# Patient Record
Sex: Male | Born: 1980 | Race: White | Hispanic: No | Marital: Single | Smoking: Current every day smoker
Health system: Southern US, Community
[De-identification: ages and names within clinical notes are randomized; demographics above are authoritative.]

## PROBLEM LIST (undated history)

## (undated) DIAGNOSIS — H269 Unspecified cataract: Secondary | ICD-10-CM

## (undated) DIAGNOSIS — H409 Unspecified glaucoma: Secondary | ICD-10-CM

## (undated) HISTORY — PX: EYE SURGERY: SHX253

---
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
## 2010-11-18 ENCOUNTER — Emergency Department (HOSPITAL_COMMUNITY)
Admission: EM | Admit: 2010-11-18 | Discharge: 2010-11-19 | Disposition: A | Discharge status: Home / Self Care | Payer: Self-pay | Attending: Emergency Medicine | Admitting: Emergency Medicine

## 2010-11-18 DIAGNOSIS — F17200 Nicotine dependence, unspecified, uncomplicated: Secondary | ICD-10-CM | POA: Insufficient documentation

## 2010-11-18 DIAGNOSIS — H109 Unspecified conjunctivitis: Secondary | ICD-10-CM | POA: Insufficient documentation

## 2012-05-11 ENCOUNTER — Emergency Department (HOSPITAL_COMMUNITY)
Admission: EM | Admit: 2012-05-11 | Discharge: 2012-05-11 | Disposition: A | Discharge status: Home / Self Care | Payer: Self-pay | Attending: Emergency Medicine | Admitting: Emergency Medicine

## 2012-05-11 ENCOUNTER — Emergency Department (HOSPITAL_COMMUNITY): Payer: Self-pay

## 2012-05-11 ENCOUNTER — Encounter (HOSPITAL_COMMUNITY): Payer: Self-pay | Admitting: *Deleted

## 2012-05-11 DIAGNOSIS — W1809XA Striking against other object with subsequent fall, initial encounter: Secondary | ICD-10-CM | POA: Insufficient documentation

## 2012-05-11 DIAGNOSIS — R209 Unspecified disturbances of skin sensation: Secondary | ICD-10-CM | POA: Insufficient documentation

## 2012-05-11 DIAGNOSIS — F17200 Nicotine dependence, unspecified, uncomplicated: Secondary | ICD-10-CM | POA: Insufficient documentation

## 2012-05-11 DIAGNOSIS — S6390XA Sprain of unspecified part of unspecified wrist and hand, initial encounter: Secondary | ICD-10-CM | POA: Insufficient documentation

## 2012-05-11 DIAGNOSIS — Y9389 Activity, other specified: Secondary | ICD-10-CM | POA: Insufficient documentation

## 2012-05-11 DIAGNOSIS — Y92009 Unspecified place in unspecified non-institutional (private) residence as the place of occurrence of the external cause: Secondary | ICD-10-CM | POA: Insufficient documentation

## 2012-05-11 DIAGNOSIS — H409 Unspecified glaucoma: Secondary | ICD-10-CM | POA: Insufficient documentation

## 2012-05-11 DIAGNOSIS — S63602A Unspecified sprain of left thumb, initial encounter: Secondary | ICD-10-CM

## 2012-05-11 HISTORY — DX: Unspecified cataract: H26.9

## 2012-05-11 HISTORY — DX: Unspecified glaucoma: H40.9

## 2012-05-11 MED ORDER — IBUPROFEN 800 MG PO TABS
800.0000 mg | ORAL_TABLET | Freq: Once | ORAL | Status: AC
  Administered 2012-05-11: 800 mg via ORAL
  Filled 2012-05-11: qty 1

## 2012-05-11 MED ORDER — HYDROCODONE-ACETAMINOPHEN 5-325 MG PO TABS: 1.0000 | ORAL_TABLET | Freq: Four times a day (QID) | ORAL | Status: AC | PRN

## 2012-05-11 NOTE — ED Provider Notes (Signed)
Medical screening examination/treatment/procedure(s) were performed by non-physician practitioner and as supervising physician I was immediately available for consultation/collaboration.   Dione Booze, MD 05/11/12 [phone]

## 2012-05-11 NOTE — ED Provider Notes (Signed)
History     CSN: [id number]  Arrival date & time 05/11/12  1455   First MD Initiated Contact with Patient 05/11/12 1620      Chief Complaint  Patient presents with  . Hand Pain    (Consider location/radiation/quality/duration/timing/severity/associated sxs/prior treatment) HPI Comments: Tripped over a piece of metal and fell onto L hand at home today.R hand dominant.  No other complaints or injuries.  Patient is a 31 y.o. male presenting with hand pain. The history is provided by the patient. No language interpreter was used.  Hand Pain This is a new problem. The current episode started today. The problem occurs constantly. The problem has been unchanged. Associated symptoms include numbness. Pertinent negatives include no weakness. Exacerbated by: movement. He has tried nothing for the symptoms.    Past Medical History  Diagnosis Date  . Glaucoma   . Cataract     Past Surgical History  Procedure Date  . Eye surgery     History reviewed. No pertinent family history.  History  Substance Use Topics  . Smoking status: Current Every Day Smoker  . Smokeless tobacco: Not on file  . Alcohol Use: No      Review of Systems  Musculoskeletal:       Thumb injury  Skin: Negative for wound.  Neurological: Positive for numbness. Negative for weakness.  All other systems reviewed and are negative.    Allergies  Review of patient's allergies indicates no known allergies.  Home Medications   Current Outpatient Rx  Name  Route  Sig  Dispense  Refill  . ADULT MULTIVITAMIN W/MINERALS CH   Oral   Take 1 tablet by mouth every morning.         Marland Kitchen HYDROCODONE-ACETAMINOPHEN 5-325 MG PO TABS   Oral   Take 1 tablet by mouth every 6 (six) hours as needed for pain.   20 tablet   0     BP 134/96  Pulse 68  Temp 97.9 F (36.6 C) (Oral)  Resp 18  Ht 5\' 9"  (1.753 m)  Wt 205 lb (92.987 kg)  BMI 30.27 kg/m2  SpO2 99%  Physical Exam  Nursing note and vitals  reviewed. Constitutional: He is oriented to person, place, and time. He appears well-developed and well-nourished.  HENT:  Head: Normocephalic and atraumatic.  Eyes: EOM are normal.  Neck: Normal range of motion.  Cardiovascular: Normal rate, regular rhythm, normal heart sounds and intact distal pulses.   Pulmonary/Chest: Effort normal and breath sounds normal. No respiratory distress.  Abdominal: Soft. He exhibits no distension. There is no tenderness.  Musculoskeletal: He exhibits tenderness.       Left hand: He exhibits decreased range of motion and tenderness. normal sensation noted. Normal strength noted.       Hands: Neurological: He is alert and oriented to person, place, and time.  Skin: Skin is warm and dry.  Psychiatric: He has a normal mood and affect. Judgment normal.    ED Course  Procedures (including critical care time)  Labs Reviewed - No data to display Dg Finger Thumb Left  05/11/2012  *RADIOLOGY REPORT*  Clinical Data: Diffuse thumb pain post fall  LEFT THUMB 2+V  Comparison: None  Findings: Osseous mineralization normal. Joint spaces preserved. Question nondisplaced metaphyseal fracture at base of distal phalanx, seen only on oblique view. No additional fracture, dislocation, or bone destruction identified.  IMPRESSION: Question nondisplaced metaphyseal fracture at base of distal phalanx left thumb.   Original Report Authenticated By: Loraine Leriche  Tyron Russell, M.D.    Discussed x-ray results with pt.  He denies having any pain in the distal phalynx at all.  1. Left thumb sprain       MDM  Thumb spica Ice, elevation rx-hydrocodone, 20 F/u with dr. Hilda Lias prn.        Evalina Field, [place] 05/11/12 1705

## 2012-05-11 NOTE — ED Notes (Signed)
Pain lt thumb, onset today after falling on it

## 2015-03-03 ENCOUNTER — Ambulatory Visit (HOSPITAL_COMMUNITY)
Admission: RE | Admit: 2015-03-03 | Discharge: 2015-03-03 | Disposition: A | Discharge status: Home / Self Care | Payer: Self-pay | Source: Ambulatory Visit | Attending: Physician Assistant | Admitting: Physician Assistant

## 2015-03-03 ENCOUNTER — Other Ambulatory Visit: Payer: Self-pay | Admitting: Physician Assistant

## 2015-03-03 DIAGNOSIS — F17200 Nicotine dependence, unspecified, uncomplicated: Secondary | ICD-10-CM | POA: Insufficient documentation

## 2015-03-03 DIAGNOSIS — R05 Cough: Secondary | ICD-10-CM | POA: Insufficient documentation

## 2015-03-03 DIAGNOSIS — R0600 Dyspnea, unspecified: Secondary | ICD-10-CM | POA: Insufficient documentation

## 2015-04-10 ENCOUNTER — Other Ambulatory Visit: Payer: Self-pay | Admitting: Physician Assistant

## 2015-04-10 LAB — CBC WITH DIFFERENTIAL/PLATELET
BASOS ABS: 0 10*3/uL (ref 0.0–0.1)
BASOS PCT: 0 % (ref 0–1)
Eosinophils Absolute: 0.3 10*3/uL (ref 0.0–0.7)
Eosinophils Relative: 2 % (ref 0–5)
HEMATOCRIT: 46.3 % (ref 39.0–52.0)
HEMOGLOBIN: 16.4 g/dL (ref 13.0–17.0)
LYMPHS PCT: 27 % (ref 12–46)
Lymphs Abs: 3.9 10*3/uL (ref 0.7–4.0)
MCH: 29.4 pg (ref 26.0–34.0)
MCHC: 35.4 g/dL (ref 30.0–36.0)
MCV: 83.1 fL (ref 78.0–100.0)
MONO ABS: 0.7 10*3/uL (ref 0.1–1.0)
MPV: 10.2 fL (ref 8.6–12.4)
Monocytes Relative: 5 % (ref 3–12)
NEUTROS ABS: 9.6 10*3/uL — AB (ref 1.7–7.7)
NEUTROS PCT: 66 % (ref 43–77)
Platelets: 283 10*3/uL (ref 150–400)
RBC: 5.57 MIL/uL (ref 4.22–5.81)
RDW: 14 % (ref 11.5–15.5)
WBC: 14.5 10*3/uL — AB (ref 4.0–10.5)

## 2015-04-13 ENCOUNTER — Ambulatory Visit: Payer: Self-pay | Admitting: Physician Assistant

## 2015-04-13 ENCOUNTER — Encounter: Payer: Self-pay | Admitting: Physician Assistant

## 2015-04-13 VITALS — BP 110/74 | HR 62 | Temp 98.1°F | Ht 68.0 in | Wt 206.5 lb

## 2015-04-13 DIAGNOSIS — D72829 Elevated white blood cell count, unspecified: Secondary | ICD-10-CM

## 2015-04-13 NOTE — Progress Notes (Signed)
BP 110/74 mmHg  Pulse 62  Temp(Src) 98.1 F (36.7 C)  Ht '5\' 8"'  (1.727 m)  Wt 206 lb 8 oz (93.668 kg)  BMI 31.41 kg/m2  SpO2 98%   Subjective:    Patient ID: Lucas Dickerson, male    DOB: [birth year], 34 y.o.   MRN: [id number]  HPI: Lucas Dickerson is a 34 y.o. male presenting on 04/13/2015 for Abnormal Lab   HPI   Pt states he turned in his [employer] discount application.  Says he never got letter about it but he got a bill for the CXR for $30-something.  Pt here with recheck WBC which has been persistently high since becoming a pt here in may.  There is no sign infection.  Relevant past medical, surgical, family and social history reviewed and updated as indicated. Interim medical history since our last visit reviewed. Allergies and medications reviewed and updated.  Current outpatient prescriptions:  .  Doxylamine-Phenylephrine-APAP (VICKS SINEX NIGHTTIME PO), Take by mouth at bedtime., Disp: , Rfl:    Review of Systems  Constitutional: Positive for appetite change. Negative for fever, chills, diaphoresis, fatigue and unexpected weight change.  HENT: Positive for congestion, dental problem and sneezing. Negative for drooling, ear pain, facial swelling, hearing loss, mouth sores, sore throat, trouble swallowing and voice change.   Eyes: Positive for pain, discharge, redness and itching. Negative for visual disturbance.  Respiratory: Positive for cough, shortness of breath and wheezing. Negative for choking.   Cardiovascular: Positive for chest pain. Negative for palpitations and leg swelling.  Gastrointestinal: Negative for vomiting, abdominal pain, diarrhea, constipation and blood in stool.  Endocrine: Positive for polydipsia. Negative for cold intolerance and heat intolerance.  Genitourinary: Negative for dysuria, hematuria and decreased urine volume.  Musculoskeletal: Positive for gait problem. Negative for back pain and arthralgias.  Skin: Negative for rash.   Allergic/Immunologic: Positive for environmental allergies.  Neurological: Positive for headaches. Negative for seizures, syncope and light-headedness.  Hematological: Negative for adenopathy.  Psychiatric/Behavioral: Positive for agitation. Negative for suicidal ideas and dysphoric mood. The patient is nervous/anxious.     Per HPI unless specifically indicated above     Objective:    BP 110/74 mmHg  Pulse 62  Temp(Src) 98.1 F (36.7 C)  Ht '5\' 8"'  (1.727 m)  Wt 206 lb 8 oz (93.668 kg)  BMI 31.41 kg/m2  SpO2 98%  Wt Readings from Last 3 Encounters:  04/13/15 206 lb 8 oz (93.668 kg)  05/11/12 205 lb (92.987 kg)    Physical Exam  Constitutional: He is oriented to person, place, and time. He appears well-developed and well-nourished.  HENT:  Head: Normocephalic and atraumatic.  Neck: Neck supple.  Cardiovascular: Normal rate and regular rhythm.   Pulmonary/Chest: Effort normal and breath sounds normal. He has no wheezes.  Abdominal: Soft. Bowel sounds are normal. There is no tenderness.  Musculoskeletal: He exhibits no edema.  Lymphadenopathy:    He has no cervical adenopathy.  Neurological: He is alert and oriented to person, place, and time.  Skin: Skin is warm and dry.  Psychiatric: He has a normal mood and affect. His behavior is normal.  Vitals reviewed.  L great toe mildly inflamed and reddish. No d/c  Results for orders placed or performed in visit on 04/10/15  CBC with Differential/Platelet  Result Value Ref Range   WBC 14.5 (H) 4.0 - 10.5 K/uL   RBC 5.57 4.22 - 5.81 MIL/uL   Hemoglobin 16.4 13.0 - 17.0 g/dL   HCT  46.3 39.0 - 52.0 %   MCV 83.1 78.0 - 100.0 fL   MCH 29.4 26.0 - 34.0 pg   MCHC 35.4 30.0 - 36.0 g/dL   RDW 14.0 11.5 - 15.5 %   Platelets 283 150 - 400 K/uL   MPV 10.2 8.6 - 12.4 fL   Neutrophils Relative % 66 43 - 77 %   Neutro Abs 9.6 (H) 1.7 - 7.7 K/uL   Lymphocytes Relative 27 12 - 46 %   Lymphs Abs 3.9 0.7 - 4.0 K/uL   Monocytes Relative 5  3 - 12 %   Monocytes Absolute 0.7 0.1 - 1.0 K/uL   Eosinophils Relative 2 0 - 5 %   Eosinophils Absolute 0.3 0.0 - 0.7 K/uL   Basophils Relative 0 0 - 1 %   Basophils Absolute 0.0 0.0 - 0.1 K/uL   Smear Review Criteria for review not met       Assessment & Plan:    Encounter Diagnosis  Name Primary?  . Leukocytosis Yes    Reviewed labs/cxr  with pt. WBC still high, cxr normal. Refer tp hematology for further evaluation  Counseled pt again on ways to help ingrowing toenail (wicking cotton under nail, avoiding too small shoes)  Will check to see if general surgeon will remove toenail ([facility]) F/u 3 mo. rto sooner prn

## 2015-04-14 DIAGNOSIS — D72829 Elevated white blood cell count, unspecified: Secondary | ICD-10-CM | POA: Insufficient documentation

## 2015-07-14 ENCOUNTER — Encounter: Payer: Self-pay | Admitting: Physician Assistant

## 2015-07-14 ENCOUNTER — Ambulatory Visit: Payer: Self-pay | Admitting: Physician Assistant

## 2015-07-14 VITALS — BP 106/68 | HR 79 | Temp 99.3°F | Ht 68.0 in | Wt 203.2 lb

## 2015-07-14 DIAGNOSIS — F17210 Nicotine dependence, cigarettes, uncomplicated: Secondary | ICD-10-CM

## 2015-07-14 DIAGNOSIS — D72829 Elevated white blood cell count, unspecified: Secondary | ICD-10-CM

## 2015-07-14 NOTE — Progress Notes (Signed)
   BP 106/68 mmHg  Pulse 79  Temp(Src) 99.3 F (37.4 C)  Ht  (1.727 m)  Wt 203 lb 3.2 oz (92.171 kg)  BMI 30.90 kg/m2  SpO2 95%   Subjective:    Patient ID: Lucas Dickerson, male    DOB: Jun 24, 1980, 35 y.o.   MRN: [id number]  HPI: Lucas Dickerson is a 35 y.o. male presenting on 07/14/2015 for Follow-up   HPI   pt states he got approved for [employer] discount.  He thinks it's 100% but isn't certain and he doesn't know when it is good until.  Pt states he got his bothersome toenail removed.  Says eyes bothering him some lately with allergies, mostly the right one.  Also some nasal stopped up.  Relevant past medical, surgical, family and social history reviewed and updated as indicated. Interim medical history since our last visit reviewed. Allergies and medications reviewed and updated.  No current outpatient prescriptions on file.   Review of Systems  Constitutional: Positive for appetite change and fatigue. Negative for fever, chills, diaphoresis and unexpected weight change.  HENT: Positive for congestion and dental problem. Negative for drooling, ear pain, facial swelling, hearing loss, mouth sores, sneezing, sore throat, trouble swallowing and voice change.   Eyes: Positive for pain, discharge, redness and itching. Negative for visual disturbance.  Respiratory: Positive for cough. Negative for choking, shortness of breath and wheezing.   Cardiovascular: Negative for chest pain, palpitations and leg swelling.  Gastrointestinal: Negative for vomiting, abdominal pain, diarrhea, constipation and blood in stool.  Endocrine: Positive for cold intolerance and heat intolerance. Negative for polydipsia.  Genitourinary: Negative for dysuria, hematuria and decreased urine volume.  Musculoskeletal: Negative for back pain, arthralgias and gait problem.  Skin: Negative for rash.  Allergic/Immunologic: Positive for environmental allergies.  Neurological: Negative for seizures, syncope,  light-headedness and headaches.  Hematological: Negative for adenopathy.  Psychiatric/Behavioral: Negative for suicidal ideas, dysphoric mood and agitation. The patient is nervous/anxious.     Per HPI unless specifically indicated above     Objective:    BP 106/68 mmHg  Pulse 79  Temp(Src) 99.3 F (37.4 C)  Ht  (1.727 m)  Wt 203 lb 3.2 oz (92.171 kg)  BMI 30.90 kg/m2  SpO2 95%  Wt Readings from Last 3 Encounters:  07/14/15 203 lb 3.2 oz (92.171 kg)  04/13/15 206 lb 8 oz (93.668 kg)  05/11/12 205 lb (92.987 kg)    Physical Exam  Constitutional: He is oriented to person, place, and time. He appears well-developed and well-nourished.  HENT:  Head: Normocephalic and atraumatic.  Neck: Neck supple.  Cardiovascular: Normal rate and regular rhythm.   Pulmonary/Chest: Effort normal and breath sounds normal. He has no wheezes.  Abdominal: Soft. Bowel sounds are normal. There is no hepatosplenomegaly. There is no tenderness.  Musculoskeletal: He exhibits no edema.  Lymphadenopathy:    He has no cervical adenopathy.  Neurological: He is alert and oriented to person, place, and time.  Skin: Skin is warm and dry.  Psychiatric: He has a normal mood and affect. His behavior is normal.  Vitals reviewed.       Assessment & Plan:   Encounter Diagnoses  Name Primary?  . Leukocytosis Yes  . Cigarette nicotine dependence, uncomplicated     Pt to bring by copy of discount letter.  Will send on to hematologist for evaluation chronic leukocystotis.  Counseled on smoking cessation  F/u 6 mo. RTO sooner prn

## 2015-07-19 DIAGNOSIS — F17210 Nicotine dependence, cigarettes, uncomplicated: Secondary | ICD-10-CM | POA: Insufficient documentation

## 2015-08-07 ENCOUNTER — Encounter (HOSPITAL_COMMUNITY): Payer: Self-pay | Admitting: Oncology

## 2015-08-07 ENCOUNTER — Encounter (HOSPITAL_COMMUNITY): Payer: Self-pay

## 2015-08-07 ENCOUNTER — Encounter (HOSPITAL_COMMUNITY): Payer: Self-pay | Attending: Oncology | Admitting: Oncology

## 2015-08-07 VITALS — BP 110/64 | HR 68 | Temp 98.2°F | Resp 20 | Ht 69.0 in | Wt 197.0 lb

## 2015-08-07 DIAGNOSIS — D72829 Elevated white blood cell count, unspecified: Secondary | ICD-10-CM | POA: Insufficient documentation

## 2015-08-07 DIAGNOSIS — Z833 Family history of diabetes mellitus: Secondary | ICD-10-CM | POA: Insufficient documentation

## 2015-08-07 DIAGNOSIS — H409 Unspecified glaucoma: Secondary | ICD-10-CM | POA: Insufficient documentation

## 2015-08-07 DIAGNOSIS — Z8249 Family history of ischemic heart disease and other diseases of the circulatory system: Secondary | ICD-10-CM | POA: Insufficient documentation

## 2015-08-07 DIAGNOSIS — Z720 Tobacco use: Secondary | ICD-10-CM

## 2015-08-07 DIAGNOSIS — D729 Disorder of white blood cells, unspecified: Secondary | ICD-10-CM

## 2015-08-07 DIAGNOSIS — F17210 Nicotine dependence, cigarettes, uncomplicated: Secondary | ICD-10-CM | POA: Insufficient documentation

## 2015-08-07 LAB — COMPREHENSIVE METABOLIC PANEL
ALK PHOS: 67 U/L (ref 38–126)
ALT: 9 U/L — AB (ref 17–63)
ANION GAP: 6 (ref 5–15)
AST: 18 U/L (ref 15–41)
Albumin: 4.2 g/dL (ref 3.5–5.0)
BILIRUBIN TOTAL: 0.9 mg/dL (ref 0.3–1.2)
BUN: 9 mg/dL (ref 6–20)
CALCIUM: 9.1 mg/dL (ref 8.9–10.3)
CO2: 27 mmol/L (ref 22–32)
CREATININE: 1.02 mg/dL (ref 0.61–1.24)
Chloride: 106 mmol/L (ref 101–111)
Glucose, Bld: 98 mg/dL (ref 65–99)
Potassium: 4.3 mmol/L (ref 3.5–5.1)
Sodium: 139 mmol/L (ref 135–145)
TOTAL PROTEIN: 7.2 g/dL (ref 6.5–8.1)

## 2015-08-07 LAB — SEDIMENTATION RATE: Sed Rate: 3 mm/hr (ref 0–16)

## 2015-08-07 LAB — CBC WITH DIFFERENTIAL/PLATELET
Basophils Absolute: 0 10*3/uL (ref 0.0–0.1)
Basophils Relative: 0 %
EOS ABS: 0.1 10*3/uL (ref 0.0–0.7)
Eosinophils Relative: 1 %
HEMATOCRIT: 46.6 % (ref 39.0–52.0)
HEMOGLOBIN: 15.7 g/dL (ref 13.0–17.0)
LYMPHS ABS: 2.8 10*3/uL (ref 0.7–4.0)
LYMPHS PCT: 25 %
MCH: 29.2 pg (ref 26.0–34.0)
MCHC: 33.7 g/dL (ref 30.0–36.0)
MCV: 86.8 fL (ref 78.0–100.0)
MONOS PCT: 7 %
Monocytes Absolute: 0.8 10*3/uL (ref 0.1–1.0)
NEUTROS PCT: 67 %
Neutro Abs: 7.6 10*3/uL (ref 1.7–7.7)
Platelets: 269 10*3/uL (ref 150–400)
RBC: 5.37 MIL/uL (ref 4.22–5.81)
RDW: 13.3 % (ref 11.5–15.5)
WBC: 11.4 10*3/uL — AB (ref 4.0–10.5)

## 2015-08-07 LAB — C-REACTIVE PROTEIN: CRP: 1.9 mg/dL — AB (ref <1.0)

## 2015-08-07 NOTE — Assessment & Plan Note (Signed)
Leukocytosis with neutrophilia predominance in th setting of chronic smoking tobacco abuse.  I reviewed the potential causes of leukocytosis (specifically mild neutrophilia) including but not limited to: ?Any active inflammatory condition or infection ?Cigarette smoking, which may be the most common cause of mild neutrophilia ?Previously diagnosed hematologic disease (such as acute and chronic leukemias, chronic myeloproliferative or myelodysplastic disease) ?The presence of, and treatment for, a chronic anxiety state, panic disorder, rage, or emotional stress (eg, posttraumatic stress disorder, depression) ?Presence of non-hematologic diseases known to increase neutrophil counts (eg, eclampsia, thyroid storm, hypercortisolism). ?Prior splenectomy or known asplenia ?Positive family history of neutrophilia ?Recent vaccination  Medications - Various medications may cause neutrophilia. However,  such cases are rare and appear in the literature as isolated case reports.  Plan today is to proceed with a CBC with peripheral smear review, evaluation for MPD, BCR-ABL to r/o CML, peripheral flow cytometry, CRP and ESR to look for occult inflammatory disease  We will see him back once his lab results have come back in, in two weeks. We will go over everything at that time.

## 2015-08-07 NOTE — Patient Instructions (Addendum)
[facility] at [facility] Discharge Instructions  RECOMMENDATIONS MADE BY THE CONSULTANT AND ANY TEST RESULTS WILL BE SENT TO YOUR REFERRING PHYSICIAN.  Today, you will have labs performed as part of the beginning of an investigation regarding your elevated white blood cell count. Return in 2 weeks for follow-up and review of lab results.  Thank you for choosing [facility] at [facility] to provide your oncology and hematology care.  To afford each patient quality time with our provider, please arrive at least 15 minutes before your scheduled appointment time.   Beginning January 23rd 2017 lab work for the [facility] will be done in the  Main lab at [facility] on 1st floor. If you have a lab appointment with the Cancer Center please come in thru the  Main Entrance and check in at the main information desk  You need to re-schedule your appointment should you arrive 10 or more minutes late.  We strive to give you quality time with our providers, and arriving late affects you and other patients whose appointments are after yours.  Also, if you no show three or more times for appointments you may be dismissed from the clinic at the providers discretion.     Again, thank you for choosing [facility].  Our hope is that these requests will decrease the amount of time that you wait before being seen by our physicians.       _____________________________________________________________  Should you have questions after your visit to [facility], please contact our office at [phone] between the hours of 8:30 a.m. and 4:30 p.m.  Voicemails left after 4:30 p.m. will not be returned until the following business day.  For prescription refill requests, have your pharmacy contact our office.         Resources For Cancer Patients and their Caregivers ? American Cancer Society: Can assist with transportation, wigs, general  needs, runs Look Good Feel Better.        36537703261-339-433-4723 ? Cancer Care: Provides financial assistance, online support groups, medication/co-pay assistance.  1-800-813-HOPE (920) 738-0556(4673) ? [facility] Assists [place] cancer patients and their families through emotional , educational and financial support.  ([phone] ? [place] DSS Where to apply for food stamps, Medicaid and utility assistance. (984)062-6345213-682-6190 ? [facility]: Transportation to medical appointments. 404-694-2057410-457-0520 ? Social Security Administration: May apply for disability if have a Stage IV cancer. [phone] ? [place] [facility], Dis[facility] and Transit Services: Assists with nutrition, care and transit needs. [phone]

## 2015-08-07 NOTE — Progress Notes (Signed)
[facility] Hematology/Oncology Consultation   Name: Lucas Dickerson      MRN: [id number]   Date: 08/07/2015 Time:11:52 AM   REFERRING PHYSICIAN:  Soyla Dryer, PA-C  REASON FOR CONSULT:  Leukocytosis   DIAGNOSIS:  Leukocytosis with neutrophilia  HISTORY OF PRESENT ILLNESS:   Mr. Lucas Dickerson is a 35 yo white American man with a past medical history significant for cigarette abuse who is referred to the [facility] for further evaluation of a leukocytosis with neutrophilia on a single documented lab check.  I personally reviewed and went over laboratory results with the patient.  The results are noted within this dictation.  Leukocytosis with neutrophilia is noted with preservation of hemoglobin and platelet count.  I personally reviewed and went over radiographic studies with the patient.  The results are noted within this dictation.  Imaging studies are unimpressive from a hematologic standpoint.  Chart is reviewed.  Patient educated on his referral to [place]. He is educated that he is not here with a cancer diagnosis. His responses: "It would be okay if I did have cancer because I've been trying to get disability for years."  Patient reports that he tries to avoid healthcare providers and therefore has not seen a primary care provider in quite some time other than Soyla Dryer of late. As a result, he admits that there are no further laboratory work that may be available to me.  He admits to sweats, but "I keep my bedroom hot."  He denies drenching night sweats. He notes some sweats of his thorax and cervical neck area at times. He admits to weight loss and decreased appetite over the past 2 years that is stable. According to records available to me, his weight is down 8 pounds compared to December 2013. He reports having 1-2 meals per day only. He uses this is an indicator of how his appetite has declined. He notes that he used to wear size 38 pants, he  is now down to a size 34. His weight today is 197 pounds, he weighed 205 pounds December 2013.  He denies any fevers, chills, nausea, vomiting, diarrhea, constipation, headaches, dizziness, double vision, abdominal pain, chest pain, diaphoresis, shortness of breath, dyspnea on exertion, blood in stool, black tarry stool, hematuria, urinary pain/burning/frequency.  He reports vision issues at time of birth. He underwent bilateral cataract surgery at 52 months old. 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 As followed by an ophthalmologist, but he cannot afford the eyedrops. He is not employed as a result of his vision and has been trying to get disability for some time.   PAST MEDICAL HISTORY:   Past Medical History  Diagnosis Date  . Glaucoma   . Cataract     ALLERGIES: No Known Allergies    MEDICATIONS: I have reviewed the patient's current medications.    No current outpatient prescriptions on file prior to visit.   No current facility-administered medications on file prior to visit.     PAST SURGICAL HISTORY Past Surgical History  Procedure Laterality Date  . Eye surgery      FAMILY HISTORY: Family History  Problem Relation Age of Onset  . Heart disease Mother   . Hypertension Mother   . Diabetes Mother   . Diabetes Father   . Hypertension Father   . Diabetes Brother   . Hypertension Brother    His mother is 48 years old and alive. She has diabetes, hypertension, and  a history of a double heart bypass. Father is 41 years old and alive. He has diabetes and hypertension. He is on forced retirement from [employer] after exposure to agent orange while in [place]. He has one brother who is 93 years old. He has diabetes and hypertension.  SOCIAL HISTORY: He admits to an 18 year smoking history smoking 1-1/2 packs per day. He's recently cut down on his smoking and smokes a half pack per day. He denies any alcohol use. He denies any illicit drug abuse. He is unemployed. He denies being  religious.   PERFORMANCE STATUS: The patient's performance status is 0 - Asymptomatic  PHYSICAL EXAM: Most Recent Vital Signs: Blood pressure 110/64, pulse 68, temperature 98.2 F (36.8 C), temperature source Oral, resp. rate 20, height '5\' 9"'  (1.753 m), weight 197 lb (89.359 kg), SpO2 99 %. General appearance: alert, cooperative, appears stated age, no distress and unaccompanied Head: Normocephalic, without obvious abnormality, atraumatic Eyes: positive findings: horizontal nystagmas with thick glasses in place Throat: normal findings: lips normal without lesions, buccal mucosa normal, tongue midline and normal, soft palate, uvula, and tonsils normal and oropharynx pink & moist without lesions or evidence of thrush and abnormal findings: dentition: poor Neck: no adenopathy, supple, symmetrical, trachea midline and thyroid not enlarged, symmetric, no tenderness/mass/nodules Lungs: clear to auscultation bilaterally and normal percussion bilaterally Heart: regular rate and rhythm, S1, S2 normal, no murmur, click, rub or gallop Abdomen: soft, non-tender; bowel sounds normal; no masses,  no organomegaly Extremities: extremities normal, atraumatic, no cyanosis or edema, Homans sign is negative, no sign of DVT and no edema, redness or tenderness in the calves or thighs Skin: Skin color, texture, turgor normal. No rashes or lesions Lymph nodes: Cervical, supraclavicular, and axillary nodes normal. Neurologic: Alert and oriented X 3, normal strength and tone. Normal symmetric reflexes. Normal coordination and gait  LABORATORY DATA:  CBC    Component Value Date/Time   WBC 14.5* 04/10/2015 1318   RBC 5.57 04/10/2015 1318   HGB 16.4 04/10/2015 1318   HCT 46.3 04/10/2015 1318   PLT 283 04/10/2015 1318   MCV 83.1 04/10/2015 1318   MCH 29.4 04/10/2015 1318   MCHC 35.4 04/10/2015 1318   RDW 14.0 04/10/2015 1318   LYMPHSABS 3.9 04/10/2015 1318   MONOABS 0.7 04/10/2015 1318   EOSABS 0.3 04/10/2015  1318   BASOSABS 0.0 04/10/2015 1318   PATHOLOGY:  N/A  ASSESSMENT/PLAN:   Leukocytosis Leukocytosis with neutrophilia predominance in th setting of chronic smoking tobacco abuse.  I reviewed the potential causes of leukocytosis (specifically mild neutrophilia) including but not limited to: ?Any active inflammatory condition or infection ?Cigarette smoking, which may be the most common cause of mild neutrophilia ?Previously diagnosed hematologic disease (such as acute and chronic leukemias, chronic myeloproliferative or myelodysplastic disease) ?The presence of, and treatment for, a chronic anxiety state, panic disorder, rage, or emotional stress (eg, posttraumatic stress disorder, depression) ?Presence of non-hematologic diseases known to increase neutrophil counts (eg, eclampsia, thyroid storm, hypercortisolism). ?Prior splenectomy or known asplenia ?Positive family history of neutrophilia ?Recent vaccination  Medications - Various medications may cause neutrophilia. However,  such cases are rare and appear in the literature as isolated case reports.  Plan today is to proceed with a CBC with peripheral smear review, evaluation for MPD, BCR-ABL to r/o CML, peripheral flow cytometry, CRP and ESR to look for occult inflammatory disease  We will see him back once his lab results have come back in, in two weeks. We  will go over everything at that time. Additional recommendations regarding further evaluation will be made at his return.   All questions were answered. The patient knows to call the clinic with any problems, questions or concerns. We can certainly see the patient much sooner if necessary.  This note is electronically signed by Molli Hazard, MD :08/07/2015 11:52 AM

## 2015-08-13 LAB — BCR-ABL1, CML/ALL, PCR, QUANT

## 2015-08-19 LAB — JAK2 V617F, W REFLEX TO CALR/E12/MPL

## 2015-08-19 LAB — CALR + JAK2 E12-15 + MPL (REFLEXED)

## 2015-08-24 ENCOUNTER — Ambulatory Visit (HOSPITAL_COMMUNITY): Payer: Self-pay | Admitting: Hematology & Oncology

## 2015-08-25 ENCOUNTER — Ambulatory Visit (HOSPITAL_COMMUNITY): Payer: Self-pay | Admitting: Hematology & Oncology

## 2015-08-25 NOTE — Assessment & Plan Note (Deleted)
Leukocytosis with neutrophilia predominance in the setting of chronic smoking tobacco abuse.  Negative work-up without any indication that leukocytosis is malignancy-driven  I reviewed the potential causes of leukocytosis (specifically mild neutrophilia) including but not limited to: ?Any active inflammatory condition or infection ?Cigarette smoking, which may be the most common cause of mild neutrophilia ?Previously diagnosed hematologic disease (such as acute and chronic leukemias, chronic myeloproliferative or myelodysplastic disease) ?The presence of, and treatment for, a chronic anxiety state, panic disorder, rage, or emotional stress (eg, posttraumatic stress disorder, depression) ?Presence of non-hematologic diseases known to increase neutrophil counts (eg, eclampsia, thyroid storm, hypercortisolism). ?Prior splenectomy or known asplenia ?Positive family history of neutrophilia ?Recent vaccination  Medications - Various medications may cause neutrophilia. However,  such cases are rare and appear in the literature as isolated case reports.  Smoking cessation education is provided.  Labs in 6 months: CBC diff  Return in 6 months for follow-up.

## 2015-08-25 NOTE — Progress Notes (Signed)
Rescheduled

## 2015-08-26 ENCOUNTER — Ambulatory Visit (HOSPITAL_COMMUNITY): Payer: Self-pay | Admitting: Oncology

## 2015-08-30 NOTE — Progress Notes (Signed)
Soyla Dryer, PA-C [place] [address]  Leukocytosis  CURRENT THERAPY: Observation.  INTERVAL HISTORY: Lucas Dickerson 35 y.o. male returns for followup of Leukocytosis with neutrophilia predominance, secondary to tobacco abuse with negative peripheral work-up.  I personally reviewed and went over laboratory results with the patient.  The results are noted within this dictation.  Labs demonstrate a stable leukocytisus with normal differential, BCR/ABL is negative, JAK2 V617R is negative, JAK2 exon 12 is negative, CALR mutation is negative, MPL mutation analysis is negative, ESR is WNL, and peripheral flow cytometry is negative.  He denies any new symptoms.    We suspect his leukocytosis is secondary to tobacco abuse.  He is not interested in stopping at this time.    Past Medical History  Diagnosis Date  . Glaucoma   . Cataract     has Leukocytosis and Cigarette nicotine dependence, uncomplicated on his problem list.     has No Known Allergies.  Current Outpatient Prescriptions on File Prior to Visit  Medication Sig Dispense Refill  . oxymetazoline (AFRIN) 0.05 % nasal spray Place 1 spray into both nostrils 2 (two) times daily as needed for congestion.    . Pseudoeph-Doxylamine-DM-APAP (NYQUIL PO) Take 30 mLs by mouth at bedtime as needed. Reported on 09/01/2015     No current facility-administered medications on file prior to visit.    Past Surgical History  Procedure Laterality Date  . Eye surgery      Denies any headaches, dizziness, double vision, fevers, chills, night sweats, nausea, vomiting, diarrhea, constipation, chest pain, heart palpitations, shortness of breath, blood in stool, black tarry stool, urinary pain, urinary burning, urinary frequency, hematuria.   PHYSICAL EXAMINATION  ECOG PERFORMANCE STATUS: 0 - Asymptomatic  Filed Vitals:   09/01/15 0815  BP: 128/72  Pulse: 75  Temp: 98.4 F (36.9 C)  Resp: 18     GENERAL:alert, no distress, well nourished, well developed, comfortable, cooperative, smiling and unaccompanied SKIN: skin color, texture, turgor are normal, no rashes or significant lesions HEAD: Normocephalic, No masses, lesions, tenderness or abnormalities EYES: Conjunctiva are pink and non-injected, positive findings: horizontal nystagmas with thick glasses in place EARS: External ears normal OROPHARYNX:lips, buccal mucosa, and tongue normal and mucous membranes are moist  NECK: supple, trachea midline LYMPH:  not examined BREAST:not examined LUNGS: clear to auscultation  HEART: regular rate & rhythm, no murmurs, no gallops, S1 normal and S2 normal ABDOMEN:abdomen soft, non-tender and normal bowel sounds BACK: Back symmetric, no curvature. EXTREMITIES:less then 2 second capillary refill, no joint deformities, effusion, or inflammation, no skin discoloration, no cyanosis  NEURO: alert & oriented x 3 with fluent speech, no focal motor/sensory deficits, gait normal    LABORATORY DATA: CBC    Component Value Date/Time   WBC 11.4* 08/07/2015 1305   RBC 5.37 08/07/2015 1305   HGB 15.7 08/07/2015 1305   HCT 46.6 08/07/2015 1305   PLT 269 08/07/2015 1305   MCV 86.8 08/07/2015 1305   MCH 29.2 08/07/2015 1305   MCHC 33.7 08/07/2015 1305   RDW 13.3 08/07/2015 1305   LYMPHSABS 2.8 08/07/2015 1305   MONOABS 0.8 08/07/2015 1305   EOSABS 0.1 08/07/2015 1305   BASOSABS 0.0 08/07/2015 1305      Chemistry      Component Value Date/Time   NA 139 08/07/2015 1305   K 4.3 08/07/2015 1305   CL 106 08/07/2015 1305   CO2 27 08/07/2015 1305   BUN 9  08/07/2015 1305   CREATININE 1.02 08/07/2015 1305      Component Value Date/Time   CALCIUM 9.1 08/07/2015 1305   ALKPHOS 67 08/07/2015 1305   AST 18 08/07/2015 1305   ALT 9* 08/07/2015 1305   BILITOT 0.9 08/07/2015 1305     Lab Results  Component Value Date   ESRSEDRATE 3 08/07/2015   Lab Results  Component Value Date    CREATININE 1.02 08/07/2015   Result: NEGATIVE for the JAK2 V617F mutation.  Result: NEGATIVE for the JAK2 V617F mutation. NEGATIVE for the JAK2 exon 12 mutations.  NEGATIVE No insertions or deletions were detected within the analyzed region of the calreticulin (CALR) gene. No MPL mutation was identified in the provided specimen of this individual.   The quantitative RT-PCR assay is negative for the b2a2 and b3a2 (p210) and e1a2 (p190) fusion gene transcripts found in chronic myelogenous leukemia and [place] positive acute lymphocytic leukemia.     PENDING LABS:   RADIOGRAPHIC STUDIES:  No results found.   PATHOLOGY:  Interpretation Peripheral Blood Flow Cytometry - NO MONOCLONAL B-CELL OR PHENOTYPICALLY ABERRANT T-CELL POPULATION. Vicente Males MD Pathologist, Electronic Signature (Case signed 08/10/2015)    ASSESSMENT AND PLAN:  Leukocytosis Leukocytosis with neutrophilia predominance in the setting of chronic smoking tobacco abuse.  Negative work-up without any indication that leukocytosis is malignancy-driven.  I reviewed the potential causes of leukocytosis (specifically mild neutrophilia) including but not limited to: ?Any active inflammatory condition or infection ?Cigarette smoking, which may be the most common cause of mild neutrophilia ?Previously diagnosed hematologic disease (such as acute and chronic leukemias, chronic myeloproliferative or myelodysplastic disease) ?The presence of, and treatment for, a chronic anxiety state, panic disorder, rage, or emotional stress (eg, posttraumatic stress disorder, depression) ?Presence of non-hematologic diseases known to increase neutrophil counts (eg, eclampsia, thyroid storm, hypercortisolism). ?Prior splenectomy or known asplenia ?Positive family history of neutrophilia ?Recent vaccination  Medications - Various medications may cause neutrophilia. However,  such cases are rare and appear in the literature as isolated  case reports.  Smoking cessation education is provided and encouraged.  This is most likely the cause of his leukocytosis.  Labs in 6 months: CBC diff  Return in 6 months for follow-up.    THERAPY PLAN:  Negative work-up for malignancy driven leukocytosis.  Will verify stability with repeat labs in 6 months.  All questions were answered. The patient knows to call the clinic with any problems, questions or concerns. We can certainly see the patient much sooner if necessary.  Patient and plan discussed with Dr. Ancil Linsey and she is in agreement with the aforementioned.   This note is electronically signed by: Doy Mince 09/01/2015 8:58 AM

## 2015-08-30 NOTE — Assessment & Plan Note (Addendum)
Leukocytosis with neutrophilia predominance in the setting of chronic smoking tobacco abuse.  Negative work-up without any indication that leukocytosis is malignancy-driven.  I reviewed the potential causes of leukocytosis (specifically mild neutrophilia) including but not limited to: ?Any active inflammatory condition or infection ?Cigarette smoking, which may be the most common cause of mild neutrophilia ?Previously diagnosed hematologic disease (such as acute and chronic leukemias, chronic myeloproliferative or myelodysplastic disease) ?The presence of, and treatment for, a chronic anxiety state, panic disorder, rage, or emotional stress (eg, posttraumatic stress disorder, depression) ?Presence of non-hematologic diseases known to increase neutrophil counts (eg, eclampsia, thyroid storm, hypercortisolism). ?Prior splenectomy or known asplenia ?Positive family history of neutrophilia ?Recent vaccination  Medications - Various medications may cause neutrophilia. However,  such cases are rare and appear in the literature as isolated case reports.  Smoking cessation education is provided and encouraged.  This is most likely the cause of his leukocytosis.  Labs in 6 months: CBC diff  Return in 6 months for follow-up.

## 2015-09-01 ENCOUNTER — Encounter (HOSPITAL_BASED_OUTPATIENT_CLINIC_OR_DEPARTMENT_OTHER): Payer: Self-pay | Admitting: Oncology

## 2015-09-01 VITALS — BP 128/72 | HR 75 | Temp 98.4°F | Resp 18 | Wt 198.0 lb

## 2015-09-01 DIAGNOSIS — Z720 Tobacco use: Secondary | ICD-10-CM

## 2015-09-01 DIAGNOSIS — D72829 Elevated white blood cell count, unspecified: Secondary | ICD-10-CM

## 2015-09-01 NOTE — Patient Instructions (Signed)
[facility] at [facility] Discharge Instructions  RECOMMENDATIONS MADE BY THE CONSULTANT AND ANY TEST RESULTS WILL BE SENT TO YOUR REFERRING PHYSICIAN.  Labs in 6 months Return in 6 months for follow up Work up was negative  Thank you for choosing [facility] at [facility] to provide your oncology and hematology care.  To afford each patient quality time with our provider, please arrive at least 15 minutes before your scheduled appointment time.   Beginning January 23rd 2017 lab work for the [facility] will be done in the  Main lab at [facility] on 1st floor. If you have a lab appointment with the Cancer Center please come in thru the  Main Entrance and check in at the main information desk  You need to re-schedule your appointment should you arrive 10 or more minutes late.  We strive to give you quality time with our providers, and arriving late affects you and other patients whose appointments are after yours.  Also, if you no show three or more times for appointments you may be dismissed from the clinic at the providers discretion.     Again, thank you for choosing [facility].  Our hope is that these requests will decrease the amount of time that you wait before being seen by our physicians.       _____________________________________________________________  Should you have questions after your visit to [facility], please contact our office at [phone] between the hours of 8:30 a.m. and 4:30 p.m.  Voicemails left after 4:30 p.m. will not be returned until the following business day.  For prescription refill requests, have your pharmacy contact our office.         Resources For Cancer Patients and their Caregivers ? American Cancer Society: Can assist with transportation, wigs, general needs, runs Look Good Feel Better.        [phone] ? Cancer Care: Provides financial assistance, online  support groups, medication/co-pay assistance.  1-800-813-HOPE 762-156-6170(4673) ? [facility] Assists [place] cancer patients and their families through emotional , educational and financial support.  [phone] ? [place] DSS Where to apply for food stamps, Medicaid and utility assistance. 442-456-5377(774)756-1852 ? [facility]: Transportation to medical appointments. [phone] ? Social Security Administration: May apply for disability if have a Stage IV cancer. [phone] ? [place] [facility], Disability and Transit Services: Assists with nutrition, care and transit needs. [phone]

## 2016-01-11 ENCOUNTER — Ambulatory Visit: Payer: Self-pay | Admitting: Physician Assistant

## 2016-01-11 ENCOUNTER — Encounter: Payer: Self-pay | Admitting: Physician Assistant

## 2016-01-11 VITALS — BP 104/70 | HR 64 | Temp 98.1°F | Ht 69.0 in | Wt 191.0 lb

## 2016-01-11 DIAGNOSIS — F17210 Nicotine dependence, cigarettes, uncomplicated: Secondary | ICD-10-CM

## 2016-01-11 DIAGNOSIS — F329 Major depressive disorder, single episode, unspecified: Secondary | ICD-10-CM

## 2016-01-11 DIAGNOSIS — F32A Depression, unspecified: Secondary | ICD-10-CM

## 2016-01-11 NOTE — Progress Notes (Signed)
BP 104/70 (BP Location: Left Arm, Patient Position: Sitting, Cuff Size: Normal)   Pulse 64   Temp 98.1 F (36.7 C) (Other (Comment))   Ht  (1.753 m)   Wt 191 lb (86.6 kg)   SpO2 98%   BMI 28.21 kg/m    Subjective:    Patient ID: Lucas Dickerson, male    DOB: February 05, 1981, 35 y.o.   MRN: [id number]  HPI: Lucas Dickerson is a 35 y.o. male presenting on 01/11/2016 for Follow-up   HPI   Not doing great.  C/o sinuses, eyes, feet swelling.  Little bit depression.  Pt states he was denied several times for disabillity.  Relevant past medical, surgical, family and social history reviewed and updated as indicated. Interim medical history since our last visit reviewed. Allergies and medications reviewed and updated.  Current Outpatient Prescriptions:  .  diphenhydrAMINE (BENADRYL) 25 mg capsule, Take 50 mg by mouth daily., Disp: , Rfl:  .  ibuprofen (ADVIL,MOTRIN) 200 MG tablet, Take 200 mg by mouth every 6 (six) hours as needed., Disp: , Rfl:  .  Pseudoeph-Doxylamine-DM-APAP (NYQUIL PO), Take 30 mLs by mouth at bedtime as needed. Reported on 09/01/2015, Disp: , Rfl:   Review of Systems  Constitutional: Positive for appetite change, fatigue and unexpected weight change. Negative for chills, diaphoresis and fever.  HENT: Positive for congestion and dental problem. Negative for drooling, ear pain, facial swelling, hearing loss, mouth sores, sneezing, sore throat, trouble swallowing and voice change.   Eyes: Positive for pain, discharge, redness and itching. Negative for visual disturbance.  Respiratory: Positive for cough, shortness of breath and wheezing. Negative for choking.   Cardiovascular: Negative for chest pain, palpitations and leg swelling.  Gastrointestinal: Negative for abdominal pain, blood in stool, constipation, diarrhea and vomiting.  Endocrine: Positive for heat intolerance. Negative for cold intolerance and polydipsia.  Genitourinary: Negative for decreased urine volume,  dysuria and hematuria.  Musculoskeletal: Positive for arthralgias and back pain. Negative for gait problem.  Skin: Negative for rash.  Allergic/Immunologic: Positive for environmental allergies.  Neurological: Positive for headaches. Negative for seizures, syncope and light-headedness.  Hematological: Negative for adenopathy.  Psychiatric/Behavioral: Positive for agitation and dysphoric mood. Negative for suicidal ideas. The patient is nervous/anxious.     Per HPI unless specifically indicated above     Objective:    BP 104/70 (BP Location: Left Arm, Patient Position: Sitting, Cuff Size: Normal)   Pulse 64   Temp 98.1 F (36.7 C) (Other (Comment))   Ht  (1.753 m)   Wt 191 lb (86.6 kg)   SpO2 98%   BMI 28.21 kg/m   Wt Readings from Last 3 Encounters:  01/11/16 191 lb (86.6 kg)  09/01/15 198 lb (89.8 kg)  08/07/15 197 lb (89.4 kg)    Physical Exam  Constitutional: He is oriented to person, place, and time. He appears well-developed and well-nourished.  HENT:  Head: Normocephalic and atraumatic.  Eyes: Conjunctivae are normal.  Neck: Neck supple.  Cardiovascular: Normal rate and regular rhythm.   Pulmonary/Chest: Effort normal and breath sounds normal. He has no wheezes.  Abdominal: Soft. Bowel sounds are normal. There is no hepatosplenomegaly. There is no tenderness.  Musculoskeletal: He exhibits no edema.  Lymphadenopathy:    He has no cervical adenopathy.  Neurological: He is alert and oriented to person, place, and time.  Skin: Skin is warm and dry.  Psychiatric: He has a normal mood and affect. His behavior is normal.  Vitals reviewed.  Assessment & Plan:   Encounter Diagnoses  Name Primary?  . Cigarette nicotine dependence, uncomplicated Yes  . Depression     -Recommend mucinex and counseled on smoking cessation -Call [facility] for counseling -F/u 6 months.  RTO sooner prn

## 2016-03-03 ENCOUNTER — Other Ambulatory Visit (HOSPITAL_COMMUNITY): Payer: Self-pay

## 2016-03-03 ENCOUNTER — Ambulatory Visit (HOSPITAL_COMMUNITY): Payer: Self-pay

## 2016-07-13 ENCOUNTER — Encounter: Payer: Self-pay | Admitting: Physician Assistant

## 2016-07-13 ENCOUNTER — Ambulatory Visit: Payer: Self-pay | Admitting: Physician Assistant

## 2016-07-13 VITALS — BP 112/76 | HR 87 | Temp 98.6°F | Ht 69.0 in | Wt 184.0 lb

## 2016-07-13 DIAGNOSIS — F329 Major depressive disorder, single episode, unspecified: Secondary | ICD-10-CM

## 2016-07-13 DIAGNOSIS — F17219 Nicotine dependence, cigarettes, with unspecified nicotine-induced disorders: Secondary | ICD-10-CM

## 2016-07-13 DIAGNOSIS — F32A Depression, unspecified: Secondary | ICD-10-CM

## 2016-07-13 DIAGNOSIS — K219 Gastro-esophageal reflux disease without esophagitis: Secondary | ICD-10-CM

## 2016-07-13 MED ORDER — RANITIDINE HCL 300 MG PO TABS: 300.0000 mg | ORAL_TABLET | Freq: Every day | ORAL | 3 refills | Status: DC

## 2016-07-13 NOTE — Progress Notes (Signed)
BP 112/76 (BP Location: Left Arm, Patient Position: Sitting, Cuff Size: Normal)   Pulse 87   Temp 98.6 F (37 C) (Other (Comment))   Ht 5\' 9"  (1.753 m)   Wt 184 lb (83.5 kg)   SpO2 98%   BMI 27.17 kg/m    Subjective:    Patient ID: Lucas Dickerson, male    DOB: 10-Jun-1980, 36 y.o.   MRN: [id number]  HPI: Lucas Dickerson is a 36 y.o. male presenting on 07/13/2016 for Follow-up (in last yrae has lost 14 lbs. 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  He says he can get insurance in September.   Pt did not call [facility] for [facility]- says he lost card with contact information.  Pt c/o anxiety.  He feels like most of it is work-related.    Pt states zantac sometimes helps and sometimes not for GERD.   Difficult to get pt to focus on medical issues as he keeps returning to complaining about his job.   Relevant past medical, surgical, family and social history reviewed and updated as indicated. Interim medical history since our last visit reviewed. Allergies and medications reviewed and updated.   Current Outpatient Prescriptions:  .  calcium carbonate (TUMS EX) 750 MG chewable tablet, Chew 1 tablet by mouth 4 (four) times daily as needed for heartburn., Disp: , Rfl:  .  diphenhydrAMINE (BENADRYL) 25 mg capsule, Take 50 mg by mouth daily., Disp: , Rfl:  .  ibuprofen (ADVIL,MOTRIN) 200 MG tablet, Take 200 mg by mouth every 6 (six) hours as needed., Disp: , Rfl:  .  pseudoephedrine-guaifenesin (MUCINEX D) 60-600 MG 12 hr tablet, Take 1 tablet by mouth every 12 (twelve) hours as needed for congestion., Disp: , Rfl:  .  ranitidine (ZANTAC) 150 MG capsule, Take 150 mg by mouth daily as needed for heartburn., Disp: , Rfl:    Review of Systems  Constitutional: Positive for appetite change, chills, diaphoresis and fatigue. Negative for fever and unexpected weight  change.  HENT: Positive for congestion, dental problem, drooling and sneezing. Negative for ear pain, facial swelling, hearing loss, mouth sores, sore throat, trouble swallowing and voice change.   Eyes: Positive for pain, discharge, redness, itching and visual disturbance.  Respiratory: Positive for cough and wheezing. Negative for choking and shortness of breath.   Cardiovascular: Negative for chest pain, palpitations and leg swelling.  Gastrointestinal: Positive for constipation. Negative for abdominal pain, blood in stool, diarrhea and vomiting.  Endocrine: Positive for polydipsia. Negative for cold intolerance and heat intolerance.  Genitourinary: Negative for decreased urine volume, dysuria and hematuria.  Musculoskeletal: Positive for arthralgias and back pain. Negative for gait problem.  Skin: Negative for rash.  Allergic/Immunologic: Positive for environmental allergies.  Neurological: Positive for light-headedness and headaches. Negative for seizures and syncope.  Hematological: Negative for adenopathy.  Psychiatric/Behavioral: Positive for agitation and dysphoric mood. Negative for suicidal ideas. The patient is nervous/anxious.     Per HPI unless specifically indicated above     Objective:    BP 112/76 (BP Location: Left Arm, Patient Position: Sitting, Cuff Size: Normal)   Pulse 87   Temp 98.6 F (37 C) (Other (Comment))   Ht 5\' 9"  (1.753 m)   Wt 184 lb (83.5 kg)   SpO2 98%   BMI 27.17 kg/m   Wt Readings from Last 3  Encounters:  07/13/16 184 lb (83.5 kg)  01/11/16 191 lb (86.6 kg)  09/01/15 198 lb (89.8 kg)    Physical Exam  Constitutional: He is oriented to person, place, and time. He appears well-developed and well-nourished.  HENT:  Head: Normocephalic and atraumatic.  Neck: Neck supple.  Cardiovascular: Normal rate and regular rhythm.   Pulmonary/Chest: Effort normal and breath sounds normal. He has no wheezes.  Abdominal: Soft. Bowel sounds are normal. There  is no hepatosplenomegaly. There is no tenderness.  Musculoskeletal: He exhibits no edema.  Lymphadenopathy:    He has no cervical adenopathy.  Neurological: He is alert and oriented to person, place, and time.  Skin: Skin is warm and dry.  Psychiatric: He has a normal mood and affect. His behavior is normal.  Vitals reviewed.       Assessment & Plan:    Encounter Diagnoses  Name Primary?  . Gastroesophageal reflux disease, esophagitis presence not specified Yes  . Depression, unspecified depression type   . Cigarette nicotine dependence with nicotine-induced disorder     -rx ranitidine 300 and gave handout on gerd -Gave [facility] card for counseling- pt to contact asap -counseled pt on Smoking cessation -F/u 1 month to recheck GERD.  RTO sooner prn

## 2016-07-13 NOTE — Patient Instructions (Signed)
Food Choices for Gastroesophageal Reflux Disease, Adult When you have gastroesophageal reflux disease (GERD), the foods you eat and your eating habits are very important. Choosing the right foods can help ease your discomfort. What guidelines do I need to follow?  Choose fruits, vegetables, whole grains, and low-fat dairy products.  Choose low-fat meat, fish, and poultry.  Limit fats such as oils, salad dressings, butter, nuts, and avocado.  Keep a food diary. This helps you identify foods that cause symptoms.  Avoid foods that cause symptoms. These may be different for everyone.  Eat small meals often instead of 3 large meals a day.  Eat your meals slowly, in a place where you are relaxed.  Limit fried foods.  Cook foods using methods other than frying.  Avoid drinking alcohol.  Avoid drinking large amounts of liquids with your meals.  Avoid bending over or lying down until 2-3 hours after eating. What foods are not recommended? These are some foods and drinks that may make your symptoms worse: Vegetables  Tomatoes. Tomato juice. Tomato and spaghetti sauce. Chili peppers. Onion and garlic. Horseradish. Fruits  Oranges, grapefruit, and lemon (fruit and juice). Meats  High-fat meats, fish, and poultry. This includes hot dogs, ribs, ham, sausage, salami, and bacon. Dairy  Whole milk and chocolate milk. Sour cream. Cream. Butter. Ice cream. Cream cheese. Drinks  Coffee and tea. Bubbly (carbonated) drinks or energy drinks. Condiments  Hot sauce. Barbecue sauce. Sweets/Desserts  Chocolate and cocoa. Donuts. Peppermint and spearmint. Fats and Oils  High-fat foods. This includes French fries and potato chips. Other  Vinegar. Strong spices. This includes black pepper, white pepper, red pepper, cayenne, curry powder, cloves, ginger, and chili powder. The items listed above may not be a complete list of foods and drinks to avoid. Contact your dietitian for more information.    This information is not intended to replace advice given to you by your health care provider. Make sure you discuss any questions you have with your health care provider. Document Released: 11/22/2011 Document Revised: 10/29/2015 Document Reviewed: 03/27/2013 Elsevier Interactive Patient Education  2017 [employer]  

## 2016-08-10 ENCOUNTER — Ambulatory Visit: Payer: Self-pay | Admitting: Physician Assistant

## 2016-08-10 ENCOUNTER — Encounter: Payer: Self-pay | Admitting: Physician Assistant

## 2016-08-10 VITALS — BP 96/58 | HR 97 | Temp 98.1°F | Ht 69.0 in | Wt 185.0 lb

## 2016-08-10 DIAGNOSIS — F17210 Nicotine dependence, cigarettes, uncomplicated: Secondary | ICD-10-CM

## 2016-08-10 DIAGNOSIS — F329 Major depressive disorder, single episode, unspecified: Secondary | ICD-10-CM

## 2016-08-10 DIAGNOSIS — K219 Gastro-esophageal reflux disease without esophagitis: Secondary | ICD-10-CM

## 2016-08-10 DIAGNOSIS — F32A Depression, unspecified: Secondary | ICD-10-CM

## 2016-08-10 NOTE — Progress Notes (Signed)
BP (!) 96/58 (BP Location: Left Arm, Patient Position: Sitting, Cuff Size: Normal)   Pulse 97   Temp 98.1 F (36.7 C)   Ht 5\' 9"  (1.753 m)   Wt 185 lb (83.9 kg)   SpO2 99%   BMI 27.32 kg/m    Subjective:    Patient ID: Lucas Dickerson, male    DOB: [birth year], 36 y.o.   MRN: [id number]  HPI: Lucas Dickerson is a 36 y.o. male presenting on 08/10/2016 for Gastroesophageal Reflux   HPI   Pt says the ranitidine is helping  Pt contacted [facility] for MH care and was given a few more numbers that he is planning to call soon  Pt is still smoking but has cut back to 1/2 ppd  [facility]  Relevant past medical, surgical, family and social history reviewed and updated as indicated. Interim medical history since our last visit reviewed. Allergies and medications reviewed and updated.   Current Outpatient Prescriptions:  .  Acetaminophen (TYLENOL PO), Take 1-2 tablets by mouth as needed (Headache)., Disp: , Rfl:  .  diphenhydrAMINE (BENADRYL) 25 mg capsule, Take 50 mg by mouth daily., Disp: , Rfl:  .  pseudoephedrine-guaifenesin (MUCINEX D) 60-600 MG 12 hr tablet, Take 1 tablet by mouth every 12 (twelve) hours as needed for congestion., Disp: , Rfl:  .  ranitidine (ZANTAC) 300 MG tablet, Take 1 tablet (300 mg total) by mouth at bedtime., Disp: 30 tablet, Rfl: 3  Review of Systems  Constitutional: Positive for fatigue. Negative for appetite change, fever and unexpected weight change.  HENT: Positive for congestion, dental problem and sore throat. Negative for ear pain, facial swelling, hearing loss, mouth sores and trouble swallowing.   Eyes: Positive for photophobia, pain, discharge, redness, itching and visual disturbance.  Respiratory: Positive for cough.   Cardiovascular: Negative for chest pain, palpitations and leg swelling.  Gastrointestinal: Positive for constipation. Negative for abdominal pain and vomiting.  Endocrine: Positive for cold intolerance and heat  intolerance.  Genitourinary: Negative for dysuria and hematuria.  Musculoskeletal: Positive for arthralgias, back pain and gait problem.  Skin: Negative for rash.  Neurological: Positive for headaches. Negative for seizures and syncope.  Hematological: Negative for adenopathy.  Psychiatric/Behavioral: Positive for agitation and dysphoric mood. Negative for suicidal ideas. The patient is nervous/anxious.     Per HPI unless specifically indicated above     Objective:    BP (!) 96/58 (BP Location: Left Arm, Patient Position: Sitting, Cuff Size: Normal)   Pulse 97   Temp 98.1 F (36.7 C)   Ht 5\' 9"  (1.753 m)   Wt 185 lb (83.9 kg)   SpO2 99%   BMI 27.32 kg/m   Wt Readings from Last 3 Encounters:  08/10/16 185 lb (83.9 kg)  07/13/16 184 lb (83.5 kg)  01/11/16 191 lb (86.6 kg)    Physical Exam  Constitutional: He is oriented to person, place, and time. He appears well-developed and well-nourished.  HENT:  Head: Normocephalic and atraumatic.  Neck: Neck supple.  Cardiovascular: Normal rate and regular rhythm.   Pulmonary/Chest: Effort normal and breath sounds normal. He has no wheezes.  Abdominal: Soft. Bowel sounds are normal. There is no hepatosplenomegaly. There is no tenderness.  Musculoskeletal: He exhibits no edema.  Lymphadenopathy:    He has no cervical adenopathy.  Neurological: He is alert and oriented to person, place, and time.  Skin: Skin is warm and dry.  Psychiatric: He has a normal mood and affect.  His behavior is normal.  Vitals reviewed.   Results for orders placed or performed in visit on 08/07/15  CBC with Differential  Result Value Ref Range   WBC 11.4 (H) 4.0 - 10.5 K/uL   RBC 5.37 4.22 - 5.81 MIL/uL   Hemoglobin 15.7 13.0 - 17.0 g/dL   HCT 16.1 09.6 - 04.5 %   MCV 86.8 78.0 - 100.0 fL   MCH 29.2 26.0 - 34.0 pg   MCHC 33.7 30.0 - 36.0 g/dL   RDW 40.9 81.1 - 91.4 %   Platelets 269 150 - 400 K/uL   Neutrophils Relative % 67 %   Neutro Abs 7.6 1.7  - 7.7 K/uL   Lymphocytes Relative 25 %   Lymphs Abs 2.8 0.7 - 4.0 K/uL   Monocytes Relative 7 %   Monocytes Absolute 0.8 0.1 - 1.0 K/uL   Eosinophils Relative 1 %   Eosinophils Absolute 0.1 0.0 - 0.7 K/uL   Basophils Relative 0 %   Basophils Absolute 0.0 0.0 - 0.1 K/uL  Comprehensive metabolic panel  Result Value Ref Range   Sodium 139 135 - 145 mmol/L   Potassium 4.3 3.5 - 5.1 mmol/L   Chloride 106 101 - 111 mmol/L   CO2 27 22 - 32 mmol/L   Glucose, Bld 98 65 - 99 mg/dL   BUN 9 6 - 20 mg/dL   Creatinine, Ser 7.82 0.61 - 1.24 mg/dL   Calcium 9.1 8.9 - 95.6 mg/dL   Total Protein 7.2 6.5 - 8.1 g/dL   Albumin 4.2 3.5 - 5.0 g/dL   AST 18 15 - 41 U/L   ALT 9 (L) 17 - 63 U/L   Alkaline Phosphatase 67 38 - 126 U/L   Total Bilirubin 0.9 0.3 - 1.2 mg/dL   GFR calc non Af Amer >60 >60 mL/min   GFR calc Af Amer >60 >60 mL/min   Anion gap 6 5 - 15  BCR-ABL1, CML/ALL, PCR, QUANT  Result Value Ref Range   b2a2 transcript Comment %   b3a2 transcript Comment %   E1A2 Transcript Comment %   Interpretation (BCRAL): Comment    Director Review (BCRAL): Comment    Background (BCRAL): Comment   JAK2 V617F, Rfx CALR/E12/MPL  Result Value Ref Range   JAK2 GenotypR Comment    BACKGROUND: Comment    Director Review, JAK2 Comment    REFLEX: Comment   Sedimentation rate  Result Value Ref Range   Sed Rate 3 0 - 16 mm/hr  C-reactive protein  Result Value Ref Range   CRP 1.9 (H) <1.0 mg/dL  CALR + JAK2 OZHY86 + MPL  Result Value Ref Range   CALR Mutation Detection Result Comment    Background: Comment    Methodology: Comment    References: Comment    Director Review Comment    JAK2 Exons 12-15 Mut Det PCR: Comment    BACKGROUND: Comment    DIRECTOR REVIEW: Comment    MPL MUTATION ANALYSIS RESULT: Comment    BACKGROUND: Comment    METHODOLOGY: Comment    REFERENCES: Comment    DIRECTOR REVIEW: Comment       Assessment & Plan:    Encounter Diagnoses  Name Primary?  .  Gastroesophageal reflux disease, esophagitis presence not specified Yes  . Depression, unspecified depression type   . Cigarette nicotine dependence, uncomplicated      -Pt to continue with ranitidine for GERD -pt to contact MH facility as discussed -follow up 6 months.  RTO  sooner prn

## 2016-10-05 ENCOUNTER — Emergency Department (HOSPITAL_COMMUNITY)
Admission: EM | Admit: 2016-10-05 | Discharge: 2016-10-05 | Disposition: A | Discharge status: Home / Self Care | Payer: Self-pay | Attending: Emergency Medicine | Admitting: Emergency Medicine

## 2016-10-05 ENCOUNTER — Encounter (HOSPITAL_COMMUNITY): Payer: Self-pay

## 2016-10-05 DIAGNOSIS — K047 Periapical abscess without sinus: Secondary | ICD-10-CM | POA: Insufficient documentation

## 2016-10-05 DIAGNOSIS — F17200 Nicotine dependence, unspecified, uncomplicated: Secondary | ICD-10-CM | POA: Insufficient documentation

## 2016-10-05 MED ORDER — CLINDAMYCIN HCL 150 MG PO CAPS
300.0000 mg | ORAL_CAPSULE | Freq: Once | ORAL | Status: AC
  Administered 2016-10-05: 300 mg via ORAL
  Filled 2016-10-05: qty 2

## 2016-10-05 MED ORDER — HYDROCODONE-ACETAMINOPHEN 5-325 MG PO TABS: 2.0000 | ORAL_TABLET | ORAL | 0 refills | Status: DC | PRN

## 2016-10-05 MED ORDER — CLINDAMYCIN HCL 300 MG PO CAPS: 300.0000 mg | ORAL_CAPSULE | Freq: Four times a day (QID) | ORAL | 0 refills | Status: DC

## 2016-10-05 NOTE — ED Triage Notes (Signed)
Patient complaining of pain in upper left teeth.  Thinks it may be an abscess.  Has been taking ibuprofen and goody powders.

## 2016-10-05 NOTE — ED Provider Notes (Signed)
[facility]-EMERGENCY DEPT Provider Note   CSN: [id number] Arrival date & time: 10/05/16  2300     History   Chief Complaint Chief Complaint  Patient presents with  . Dental Pain    HPI Lucas Dickerson is a 36 y.o. male.  The history is provided by the patient. No language interpreter was used.  Dental Pain   This is a recurrent problem. The current episode started more than 1 week ago. The problem occurs constantly. The problem has been gradually worsening. The pain is moderate. He has tried nothing for the symptoms. The treatment provided no relief.  Pt complains of pain in left upper face.  Multiple bad teeth  Past Medical History:  Diagnosis Date  . Cataract   . Glaucoma     Patient Active Problem List   Diagnosis Date Noted  . Cigarette nicotine dependence, uncomplicated 07/19/2015  . Leukocytosis 04/14/2015    Past Surgical History:  Procedure Laterality Date  . EYE SURGERY         Home Medications    Prior to Admission medications   Medication Sig Start Date End Date Taking? Authorizing Provider  Acetaminophen (TYLENOL PO) Take 1-2 tablets by mouth as needed (Headache).    Historical Provider, MD  diphenhydrAMINE (BENADRYL) 25 mg capsule Take 50 mg by mouth daily.    Historical Provider, MD  pseudoephedrine-guaifenesin (MUCINEX D) 60-600 MG 12 hr tablet Take 1 tablet by mouth every 12 (twelve) hours as needed for congestion.    Historical Provider, MD  ranitidine (ZANTAC) 300 MG tablet Take 1 tablet (300 mg total) by mouth at bedtime. 07/13/16   Jacquelin Hawking, PA-C    Family History Family History  Problem Relation Age of Onset  . Heart disease Mother   . Hypertension Mother   . Diabetes Mother   . Diabetes Father   . Hypertension Father   . Diabetes Brother   . Hypertension Brother     Social History Social History  Substance Use Topics  . Smoking status: Current Every Day Smoker    Packs/day: 0.50    Years: 18.00  . Smokeless tobacco: Former Neurosurgeon     Types: Snuff     Comment: has cut down to 1/2 ppd  . Alcohol use No     Allergies   Poison oak extract [poison oak extract]   Review of Systems Review of Systems  All other systems reviewed and are negative.    Physical Exam Updated Vital Signs BP (!) 133/96 (BP Location: Right Arm)   Pulse 81   Temp 98.3 F (36.8 C) (Oral)   Resp 16   Ht  (1.753 m)   Wt 78.9 kg   SpO2 97%   BMI 25.70 kg/m   Physical Exam  Constitutional: He appears well-developed and well-nourished.  HENT:  Head: Normocephalic.  Mouth/Throat: Oropharynx is clear and moist.  Swelling left face, multiple broken teeth.  Oozing from gum  Neck: Normal range of motion.  Cardiovascular: Normal rate.   Pulmonary/Chest: Effort normal.  Musculoskeletal: Normal range of motion.  Neurological: He is alert.  Skin: Skin is warm.  Psychiatric: He has a normal mood and affect.  Nursing note and vitals reviewed.    ED Treatments / Results  Labs (all labs ordered are listed, but only abnormal results are displayed) Labs Reviewed - No data to display  EKG  EKG Interpretation None       Radiology No results found.  Procedures Procedures (including critical care time)  Medications Ordered in ED Medications  clindamycin (CLEOCIN) capsule 300 mg (not administered)     Initial Impression / Assessment and Plan / ED Course  I have reviewed the triage vital signs and the nursing notes.  Pertinent labs & imaging results that were available during my care of the patient were reviewed by me and considered in my medical decision making (see chart for details).       Final Clinical Impressions(s) / ED Diagnoses   Final diagnoses:  Dental infection    New Prescriptions New Prescriptions   CLINDAMYCIN (CLEOCIN) 300 MG CAPSULE    Take 1 capsule (300 mg total) by mouth every 6 (six) hours.   HYDROCODONE-ACETAMINOPHEN (NORCO/VICODIN) 5-325 MG TABLET    Take 2 tablets by mouth every 4  (four) hours as needed.  An After Visit Summary was printed and given to the patient.   Lonia Skinner [place], PA-C 10/05/16 [phone]    Gilda Crease, MD 10/07/16 [phone]

## 2016-12-18 ENCOUNTER — Other Ambulatory Visit: Payer: Self-pay | Admitting: Physician Assistant

## 2017-01-25 ENCOUNTER — Encounter: Payer: Self-pay | Admitting: Physician Assistant

## 2017-01-25 ENCOUNTER — Ambulatory Visit: Payer: Self-pay | Admitting: Physician Assistant

## 2017-01-25 VITALS — BP 104/70 | HR 97 | Temp 98.1°F | Ht 69.0 in | Wt 200.5 lb

## 2017-01-25 DIAGNOSIS — R21 Rash and other nonspecific skin eruption: Secondary | ICD-10-CM

## 2017-01-25 MED ORDER — TRIAMCINOLONE ACETONIDE 0.1 % EX CREA: 1.0000 "application " | TOPICAL_CREAM | Freq: Two times a day (BID) | CUTANEOUS | 0 refills | Status: DC | PRN

## 2017-01-25 NOTE — Progress Notes (Signed)
   BP 104/70 (BP Location: Left Arm, Patient Position: Sitting, Cuff Size: Normal)   Pulse 97   Temp 98.1 F (36.7 C) (Other (Comment))   Ht 5\' 9"  (1.753 m)   Wt 200 lb 8 oz (90.9 kg)   SpO2 98%   BMI 29.61 kg/m    Subjective:    Patient ID: Lucas Dickerson, male    DOB: 1980-09-08, 36 y.o.   MRN: [id number]  HPI: Lucas Dickerson is a 36 y.o. male presenting on 01/25/2017 for Rash (has had rash that sometimes itches to arms and legs since Monday)   HPI   Chief Complaint  Patient presents with  . Rash    has had rash that sometimes itches to arms and legs since Monday    Itches some. Was worse yesterday.  Pt wants to make sure it isn't poison ivy  Relevant past medical, surgical, family and social history reviewed and updated as indicated. Interim medical history since our last visit reviewed. Allergies and medications reviewed and updated.   Current Outpatient Prescriptions:  .  Acetaminophen (TYLENOL PO), Take 1-2 tablets by mouth as needed (Headache)., Disp: , Rfl:  .  diphenhydrAMINE (BENADRYL) 25 mg capsule, Take 50 mg by mouth daily., Disp: , Rfl:  .  pseudoephedrine-guaifenesin (MUCINEX D) 60-600 MG 12 hr tablet, Take 1 tablet by mouth every 12 (twelve) hours as needed for congestion., Disp: , Rfl:  .  ranitidine (ZANTAC) 300 MG tablet, TAKE ONE TABLET BY MOUTH AT BEDTIME, Disp: 30 tablet, Rfl: 3   Review of Systems  Constitutional: Negative for fever.  HENT: Negative for trouble swallowing.   Respiratory: Negative for shortness of breath.   Cardiovascular: Negative for chest pain.  Gastrointestinal: Negative for abdominal pain.  Skin: Positive for rash.    Per HPI unless specifically indicated above     Objective:    BP 104/70 (BP Location: Left Arm, Patient Position: Sitting, Cuff Size: Normal)   Pulse 97   Temp 98.1 F (36.7 C) (Other (Comment))   Ht 5\' 9"  (1.753 m)   Wt 200 lb 8 oz (90.9 kg)   SpO2 98%   BMI 29.61 kg/m   Wt Readings from Last 3  Encounters:  01/25/17 200 lb 8 oz (90.9 kg)  10/05/16 174 lb (78.9 kg)  08/10/16 185 lb (83.9 kg)    Physical Exam  Constitutional: He is oriented to person, place, and time. He appears well-developed and well-nourished.  HENT:  Head: Normocephalic and atraumatic.  Pulmonary/Chest: Effort normal.  Neurological: He is alert and oriented to person, place, and time.  Skin: Skin is warm and dry. Rash noted.  About 6 spots on fore arms,  scattered.    Patches are about 30mm diameter, dry scaly areas.  No vesicles.  There is a patch posterior L knee which is mildly whelty  Psychiatric: He has a normal mood and affect. His behavior is normal.  Nursing note and vitals reviewed.       Assessment & Plan:   Encounter Diagnosis  Name Primary?  . Rash Yes     -discussed with pt area behind knee looks like some heat rash.  Spots on arms look different.  Reassured pt that none of it looks like poison ivy.  It is also reassuring that itching improved from yesterday to today.  rx TAC to use prn -pt to follow up as scheduled.  RTO sooner prn

## 2017-01-30 ENCOUNTER — Encounter: Payer: Self-pay | Admitting: Physician Assistant

## 2017-01-30 ENCOUNTER — Ambulatory Visit: Payer: Self-pay | Admitting: Physician Assistant

## 2017-01-30 VITALS — BP 102/74 | HR 103 | Temp 99.7°F | Ht 69.0 in | Wt 197.5 lb

## 2017-01-30 DIAGNOSIS — L0100 Impetigo, unspecified: Secondary | ICD-10-CM

## 2017-01-30 DIAGNOSIS — L309 Dermatitis, unspecified: Secondary | ICD-10-CM

## 2017-01-30 MED ORDER — TRIAMCINOLONE ACETONIDE 0.1 % EX CREA: 1.0000 "application " | TOPICAL_CREAM | Freq: Two times a day (BID) | CUTANEOUS | 0 refills | Status: DC | PRN

## 2017-01-30 MED ORDER — CEPHALEXIN 500 MG PO CAPS: 500.0000 mg | ORAL_CAPSULE | Freq: Three times a day (TID) | ORAL | 0 refills | Status: DC

## 2017-01-30 NOTE — Progress Notes (Signed)
   BP 102/74 (BP Location: Left Arm, Patient Position: Sitting, Cuff Size: Normal)   Pulse (!) 103   Temp 99.7 F (37.6 C)   Ht 5\' 9"  (1.753 m)   Wt 197 lb 8 oz (89.6 kg)   SpO2 95%   BMI 29.17 kg/m    Subjective:    Patient ID: Lucas Dickerson, male    DOB: 15-Oct-1980, 36 y.o.   MRN: [id number]  HPI: Lucas Dickerson is a 36 y.o. male presenting on 01/30/2017 for Rash   HPI   Pt seen last week for rash.  Given TAC cream.  He says his arms are better and LE is worse.  Relevant past medical, surgical, family and social history reviewed and updated as indicated. Interim medical history since our last visit reviewed. Allergies and medications reviewed and updated.  Review of Systems  Respiratory: Negative for shortness of breath.   Cardiovascular: Negative for chest pain.  Gastrointestinal: Negative for abdominal pain.    Per HPI unless specifically indicated above     Objective:    BP 102/74 (BP Location: Left Arm, Patient Position: Sitting, Cuff Size: Normal)   Pulse (!) 103   Temp 99.7 F (37.6 C)   Ht 5\' 9"  (1.753 m)   Wt 197 lb 8 oz (89.6 kg)   SpO2 95%   BMI 29.17 kg/m   Wt Readings from Last 3 Encounters:  01/30/17 197 lb 8 oz (89.6 kg)  01/25/17 200 lb 8 oz (90.9 kg)  10/05/16 174 lb (78.9 kg)    Physical Exam  Constitutional: He is oriented to person, place, and time. He appears well-developed and well-nourished.  Neurological: He is alert and oriented to person, place, and time.  Skin: Skin is warm and dry. Rash noted.  Spots BUE improved, dried from last week.  Rash posterior R knee is red, angry and has extended aroung to the front of the knee.  There is weeping and crusting.  Psychiatric: He has a normal mood and affect. His behavior is normal.  Nursing note and vitals reviewed.         Assessment & Plan:   Encounter Diagnoses  Name Primary?  . Impetigo Yes  . Dermatitis      rx keflex for LE and pt can continue to use the TAC cream on UE  as needed.  Pt has routine follow up next week.  He is to RTO sooner prn worsening or new symptoms

## 2017-02-08 ENCOUNTER — Encounter: Payer: Self-pay | Admitting: Physician Assistant

## 2017-02-08 ENCOUNTER — Ambulatory Visit: Payer: Self-pay | Admitting: Physician Assistant

## 2017-02-08 VITALS — BP 100/70 | HR 93 | Temp 98.8°F | Ht 69.0 in | Wt 201.0 lb

## 2017-02-08 DIAGNOSIS — F17210 Nicotine dependence, cigarettes, uncomplicated: Secondary | ICD-10-CM

## 2017-02-08 DIAGNOSIS — Z13220 Encounter for screening for lipoid disorders: Secondary | ICD-10-CM

## 2017-02-08 DIAGNOSIS — K219 Gastro-esophageal reflux disease without esophagitis: Secondary | ICD-10-CM

## 2017-02-08 NOTE — Progress Notes (Signed)
BP 100/70 (BP Location: Left Arm, Patient Position: Sitting, Cuff Size: Normal) Comment: 10  Pulse 93   Temp 98.8 F (37.1 C)   Ht 5\' 9"  (1.753 m)   Wt 201 lb (91.2 kg)   SpO2 97%   BMI 29.68 kg/m    Subjective:    Patient ID: Lucas Garibaldiric J Geesey, male    DOB: 11/16/1980, 36 y.o.   MRN: [id number]  HPI: Lucas Dickerson is a 36 y.o. male presenting on 02/08/2017 for Gastroesophageal Reflux   HPI   Pt is doing well  Pt says reflux is doing pretty good.  He says the rantidine is taking care of his symtpoms.   He says his rash is almost gone (seen for rash last week)  Pt is still smoking.    Relevant past medical, surgical, family and social history reviewed and updated as indicated. Interim medical history since our last visit reviewed. Allergies and medications reviewed and updated.   Current Outpatient Prescriptions:  .  Acetaminophen (TYLENOL PO), Take 1-2 tablets by mouth as needed (Headache)., Disp: , Rfl:  .  diphenhydrAMINE (BENADRYL) 25 mg capsule, Take 50 mg by mouth daily., Disp: , Rfl:  .  pseudoephedrine-guaifenesin (MUCINEX D) 60-600 MG 12 hr tablet, Take 1 tablet by mouth every 12 (twelve) hours as needed for congestion., Disp: , Rfl:  .  ranitidine (ZANTAC) 300 MG tablet, TAKE ONE TABLET BY MOUTH AT BEDTIME, Disp: 30 tablet, Rfl: 3   Review of Systems  Constitutional: Positive for appetite change, chills, diaphoresis and fatigue. Negative for fever and unexpected weight change.  HENT: Positive for congestion, dental problem and sneezing. Negative for drooling, ear pain, facial swelling, hearing loss, mouth sores, sore throat, trouble swallowing and voice change.   Eyes: Positive for pain, discharge, redness, itching and visual disturbance.  Respiratory: Positive for cough and wheezing. Negative for choking and shortness of breath.   Cardiovascular: Negative for chest pain, palpitations and leg swelling.  Gastrointestinal: Negative for abdominal pain, blood in stool,  constipation, diarrhea and vomiting.  Endocrine: Positive for cold intolerance, heat intolerance and polydipsia.  Genitourinary: Negative for decreased urine volume, dysuria and hematuria.  Musculoskeletal: Positive for arthralgias, back pain and gait problem.  Skin: Negative for rash.  Allergic/Immunologic: Positive for environmental allergies.  Neurological: Positive for headaches. Negative for seizures, syncope and light-headedness.  Hematological: Negative for adenopathy.  Psychiatric/Behavioral: Positive for agitation. Negative for dysphoric mood and suicidal ideas. The patient is nervous/anxious.     Per HPI unless specifically indicated above     Objective:    BP 100/70 (BP Location: Left Arm, Patient Position: Sitting, Cuff Size: Normal) Comment: 10  Pulse 93   Temp 98.8 F (37.1 C)   Ht 5\' 9"  (1.753 m)   Wt 201 lb (91.2 kg)   SpO2 97%   BMI 29.68 kg/m   Wt Readings from Last 3 Encounters:  02/08/17 201 lb (91.2 kg)  01/30/17 197 lb 8 oz (89.6 kg)  01/25/17 200 lb 8 oz (90.9 kg)    Physical Exam  Constitutional: He is oriented to person, place, and time. He appears well-developed and well-nourished.  HENT:  Head: Normocephalic and atraumatic.  Neck: Neck supple.  Cardiovascular: Normal rate and regular rhythm.   Pulmonary/Chest: Effort normal and breath sounds normal. He has no wheezes.  Abdominal: Soft. Bowel sounds are normal. There is no hepatosplenomegaly. There is no tenderness.  Musculoskeletal: He exhibits no edema.  Lymphadenopathy:    He has no cervical  adenopathy.  Neurological: He is alert and oriented to person, place, and time.  Skin: Skin is warm and dry.  Psychiatric: He has a normal mood and affect. His behavior is normal.  Vitals reviewed.       Assessment & Plan:    Encounter Diagnoses  Name Primary?  . Gastroesophageal reflux disease, esophagitis presence not specified Yes  . Screening cholesterol level   . Cigarette nicotine  dependence, uncomplicated     -pt to get fasting labs tomorrow to check lipids -pt to continue current medications for GERD -counseled on smoking cessation -Pt to follow up in 6 months.  RTO sooner prn

## 2017-02-08 NOTE — Patient Instructions (Signed)
Steps to Quit Smoking Smoking tobacco can be harmful to your health and can affect almost every organ in your body. Smoking puts you, and those around you, at risk for developing many serious chronic diseases. Quitting smoking is difficult, but it is one of the best things that you can do for your health. It is never too late to quit. What are the benefits of quitting smoking? When you quit smoking, you lower your risk of developing serious diseases and conditions, such as:  Lung cancer or lung disease, such as COPD.  Heart disease.  Stroke.  Heart attack.  Infertility.  Osteoporosis and bone fractures.  Additionally, symptoms such as coughing, wheezing, and shortness of breath may get better when you quit. You may also find that you get sick less often because your body is stronger at fighting off colds and infections. If you are pregnant, quitting smoking can help to reduce your chances of having a baby of low birth weight. How do I get ready to quit? When you decide to quit smoking, create a plan to make sure that you are successful. Before you quit:  Pick a date to quit. Set a date within the next two weeks to give you time to prepare.  Write down the reasons why you are quitting. Keep this list in places where you will see it often, such as on your bathroom mirror or in your car or wallet.  Identify the people, places, things, and activities that make you want to smoke (triggers) and avoid them. Make sure to take these actions: ? Throw away all cigarettes at home, at work, and in your car. ? Throw away smoking accessories, such as ashtrays and lighters. ? Clean your car and make sure to empty the ashtray. ? Clean your home, including curtains and carpets.  Tell your family, friends, and coworkers that you are quitting. Support from your loved ones can make quitting easier.  Talk with your health care provider about your options for quitting smoking.  Find out what treatment  options are covered by your health insurance.  What strategies can I use to quit smoking? Talk with your healthcare provider about different strategies to quit smoking. Some strategies include:  Quitting smoking altogether instead of gradually lessening how much you smoke over a period of time. Research shows that quitting "cold turkey" is more successful than gradually quitting.  Attending in-person counseling to help you build problem-solving skills. You are more likely to have success in quitting if you attend several counseling sessions. Even short sessions of 10 minutes can be effective.  Finding resources and support systems that can help you to quit smoking and remain smoke-free after you quit. These resources are most helpful when you use them often. They can include: ? Online chats with a counselor. ? Telephone quitlines. ? Printed self-help materials. ? Support groups or group counseling. ? Text messaging programs. ? Mobile phone applications.  Taking medicines to help you quit smoking. (If you are pregnant or breastfeeding, talk with your health care provider first.) Some medicines contain nicotine and some do not. Both types of medicines help with cravings, but the medicines that include nicotine help to relieve withdrawal symptoms. Your health care provider may recommend: ? Nicotine patches, gum, or lozenges. ? Nicotine inhalers or sprays. ? Non-nicotine medicine that is taken by mouth.  Talk with your health care provider about combining strategies, such as taking medicines while you are also receiving in-person counseling. Using these two strategies together   makes you more likely to succeed in quitting than if you used either strategy on its own. If you are pregnant or breastfeeding, talk with your health care provider about finding counseling or other support strategies to quit smoking. Do not take medicine to help you quit smoking unless told to do so by your health care  provider. What things can I do to make it easier to quit? Quitting smoking might feel overwhelming at first, but there is a lot that you can do to make it easier. Take these important actions:  Reach out to your family and friends and ask that they support and encourage you during this time. Call telephone quitlines, reach out to support groups, or work with a counselor for support.  Ask people who smoke to avoid smoking around you.  Avoid places that trigger you to smoke, such as bars, parties, or smoke-break areas at work.  Spend time around people who do not smoke.  Lessen stress in your life, because stress can be a smoking trigger for some people. To lessen stress, try: ? Exercising regularly. ? Deep-breathing exercises. ? Yoga. ? Meditating. ? Performing a body scan. This involves closing your eyes, scanning your body from head to toe, and noticing which parts of your body are particularly tense. Purposefully relax the muscles in those areas.  Download or purchase mobile phone or tablet apps (applications) that can help you stick to your quit plan by providing reminders, tips, and encouragement. There are many free apps, such as QuitGuide from the CDC (Centers for Disease Control and Prevention). You can find other support for quitting smoking (smoking cessation) through smokefree.gov and other websites.  How will I feel when I quit smoking? Within the first 24 hours of quitting smoking, you may start to feel some withdrawal symptoms. These symptoms are usually most noticeable 2-3 days after quitting, but they usually do not last beyond 2-3 weeks. Changes or symptoms that you might experience include:  Mood swings.  Restlessness, anxiety, or irritation.  Difficulty concentrating.  Dizziness.  Strong cravings for sugary foods in addition to nicotine.  Mild weight gain.  Constipation.  Nausea.  Coughing or a sore throat.  Changes in how your medicines work in your  body.  A depressed mood.  Difficulty sleeping (insomnia).  After the first 2-3 weeks of quitting, you may start to notice more positive results, such as:  Improved sense of smell and taste.  Decreased coughing and sore throat.  Slower heart rate.  Lower blood pressure.  Clearer skin.  The ability to breathe more easily.  Fewer sick days.  Quitting smoking is very challenging for most people. Do not get discouraged if you are not successful the first time. Some people need to make many attempts to quit before they achieve long-term success. Do your best to stick to your quit plan, and talk with your health care provider if you have any questions or concerns. This information is not intended to replace advice given to you by your health care provider. Make sure you discuss any questions you have with your health care provider. Document Released: 05/17/2001 Document Revised: 01/19/2016 Document Reviewed: 10/07/2014 Elsevier Interactive Patient Education  2017 [employer]  

## 2017-02-10 ENCOUNTER — Other Ambulatory Visit (HOSPITAL_COMMUNITY)
Admission: RE | Admit: 2017-02-10 | Discharge: 2017-02-10 | Disposition: A | Discharge status: Home / Self Care | Payer: Self-pay | Source: Ambulatory Visit | Attending: Physician Assistant | Admitting: Physician Assistant

## 2017-02-10 DIAGNOSIS — Z13220 Encounter for screening for lipoid disorders: Secondary | ICD-10-CM | POA: Insufficient documentation

## 2017-02-10 LAB — COMPREHENSIVE METABOLIC PANEL
ALBUMIN: 3.9 g/dL (ref 3.5–5.0)
ALK PHOS: 63 U/L (ref 38–126)
ALT: 14 U/L — AB (ref 17–63)
ANION GAP: 6 (ref 5–15)
AST: 15 U/L (ref 15–41)
BUN: 13 mg/dL (ref 6–20)
CALCIUM: 9.4 mg/dL (ref 8.9–10.3)
CO2: 28 mmol/L (ref 22–32)
CREATININE: 0.97 mg/dL (ref 0.61–1.24)
Chloride: 102 mmol/L (ref 101–111)
GFR calc Af Amer: >60 mL/min (ref >60)
GFR calc non Af Amer: >60 mL/min (ref >60)
Glucose, Bld: 93 mg/dL (ref 65–99)
Potassium: 3.9 mmol/L (ref 3.5–5.1)
SODIUM: 136 mmol/L (ref 135–145)
Total Bilirubin: 0.8 mg/dL (ref 0.3–1.2)
Total Protein: 7 g/dL (ref 6.5–8.1)

## 2017-02-10 LAB — LIPID PANEL
Cholesterol: 177 mg/dL (ref 0–200)
HDL: 47 mg/dL (ref >40)
LDL Cholesterol: 113 mg/dL — ABNORMAL HIGH (ref 0–99)
Total CHOL/HDL Ratio: 3.8 RATIO
Triglycerides: 86 mg/dL (ref <150)
VLDL: 17 mg/dL (ref 0–40)

## 2017-05-07 ENCOUNTER — Other Ambulatory Visit: Payer: Self-pay | Admitting: Physician Assistant

## 2017-08-08 ENCOUNTER — Ambulatory Visit: Payer: Self-pay | Admitting: Physician Assistant

## 2017-08-15 ENCOUNTER — Encounter: Payer: Self-pay | Admitting: Physician Assistant

## 2017-08-15 ENCOUNTER — Ambulatory Visit: Payer: Self-pay | Admitting: Physician Assistant

## 2017-08-15 VITALS — BP 135/84 | HR 99 | Temp 98.1°F | Ht 69.0 in | Wt 215.0 lb

## 2017-08-15 DIAGNOSIS — E669 Obesity, unspecified: Secondary | ICD-10-CM

## 2017-08-15 DIAGNOSIS — K219 Gastro-esophageal reflux disease without esophagitis: Secondary | ICD-10-CM | POA: Insufficient documentation

## 2017-08-15 DIAGNOSIS — F17210 Nicotine dependence, cigarettes, uncomplicated: Secondary | ICD-10-CM

## 2017-08-15 MED ORDER — RANITIDINE HCL 300 MG PO TABS: 300.0000 mg | ORAL_TABLET | Freq: Every day | ORAL | 11 refills | Status: DC

## 2017-08-15 NOTE — Patient Instructions (Signed)
Coping with Quitting Smoking Quitting smoking is a physical and mental challenge. You will face cravings, withdrawal symptoms, and temptation. Before quitting, work with your health care provider to make a plan that can help you cope. Preparation can help you quit and keep you from giving in. How can I cope with cravings? Cravings usually last for 5-10 minutes. If you get through it, the craving will pass. Consider taking the following actions to help you cope with cravings:  Keep your mouth busy: ? Chew sugar-free gum. ? Suck on hard candies or a straw. ? Brush your teeth.  Keep your hands and body busy: ? Immediately change to a different activity when you feel a craving. ? Squeeze or play with a ball. ? Do an activity or a hobby, like making bead jewelry, practicing needlepoint, or working with wood. ? Mix up your normal routine. ? Take a short exercise break. Go for a quick walk or run up and down stairs. ? Spend time in public places where smoking is not allowed.  Focus on doing something kind or helpful for someone else.  Call a friend or family member to talk during a craving.  Join a support group.  Call a quit line, such as 1-800-QUIT-NOW.  Talk with your health care provider about medicines that might help you cope with cravings and make quitting easier for you.  How can I deal with withdrawal symptoms? Your body may experience negative effects as it tries to get used to not having nicotine in the system. These effects are called withdrawal symptoms. They may include:  Feeling hungrier than normal.  Trouble concentrating.  Irritability.  Trouble sleeping.  Feeling depressed.  Restlessness and agitation.  Craving a cigarette.  To manage withdrawal symptoms:  Avoid places, people, and activities that trigger your cravings.  Remember why you want to quit.  Get plenty of sleep.  Avoid coffee and other caffeinated drinks. These may worsen some of your  symptoms.  How can I handle social situations? Social situations can be difficult when you are quitting smoking, especially in the first few weeks. To manage this, you can:  Avoid parties, bars, and other social situations where people might be smoking.  Avoid alcohol.  Leave right away if you have the urge to smoke.  Explain to your family and friends that you are quitting smoking. Ask for understanding and support.  Plan activities with friends or family where smoking is not an option.  What are some ways I can cope with stress? Wanting to smoke may cause stress, and stress can make you want to smoke. Find ways to manage your stress. Relaxation techniques can help. For example:  Breathe slowly and deeply, in through your nose and out through your mouth.  Listen to soothing, relaxing music.  Talk with a family member or friend about your stress.  Light a candle.  Soak in a bath or take a shower.  Think about a peaceful place.  What are some ways I can prevent weight gain? Be aware that many people gain weight after they quit smoking. However, not everyone does. To keep from gaining weight, have a plan in place before you quit and stick to the plan after you quit. Your plan should include:  Having healthy snacks. When you have a craving, it may help to: ? Eat plain popcorn, crunchy carrots, celery, or other cut vegetables. ? Chew sugar-free gum.  Changing how you eat: ? Eat small portion sizes at meals. ?   Eat 4-6 small meals throughout the day instead of 1-2 large meals a day. ? Be mindful when you eat. Do not watch television or do other things that might distract you as you eat.  Exercising regularly: ? Make time to exercise each day. If you do not have time for a long workout, do short bouts of exercise for 5-10 minutes several times a day. ? Do some form of strengthening exercise, like weight lifting, and some form of aerobic exercise, like running or  swimming.  Drinking plenty of water or other low-calorie or no-calorie drinks. Drink 6-8 glasses of water daily, or as much as instructed by your health care provider.  Summary  Quitting smoking is a physical and mental challenge. You will face cravings, withdrawal symptoms, and temptation to smoke again. Preparation can help you as you go through these challenges.  You can cope with cravings by keeping your mouth busy (such as by chewing gum), keeping your body and hands busy, and making calls to family, friends, or a helpline for people who want to quit smoking.  You can cope with withdrawal symptoms by avoiding places where people smoke, avoiding drinks with caffeine, and getting plenty of rest.  Ask your health care provider about the different ways to prevent weight gain, avoid stress, and handle social situations. This information is not intended to replace advice given to you by your health care provider. Make sure you discuss any questions you have with your health care provider. Document Released: 05/20/2016 Document Revised: 05/20/2016 Document Reviewed: 05/20/2016 Elsevier Interactive Patient Education  2018 [employer] Inc.  

## 2017-08-15 NOTE — Progress Notes (Signed)
BP 135/84 (BP Location: Right Arm, Patient Position: Sitting, Cuff Size: Normal)   Pulse 99   Temp 98.1 F (36.7 C)   Ht 5\' 9"  (1.753 m)   Wt 215 lb (97.5 kg)   SpO2 96%   BMI 31.75 kg/m    Subjective:    Patient ID: Lucas Dickerson, male    DOB: 08/13/1980, 37 y.o.   MRN: [id number]  HPI: Lucas Dickerson is a 37 y.o. male presenting on 08/15/2017 for Gastroesophageal Reflux   HPI   Ranitidine is working for his GERD  He is still smoking  Pt states no other issues  Relevant past medical, surgical, family and social history reviewed and updated as indicated. Interim medical history since our last visit reviewed. Allergies and medications reviewed and updated.   Current Outpatient Medications:  .  Acetaminophen (TYLENOL PO), Take 1-2 tablets by mouth as needed (Headache)., Disp: , Rfl:  .  Phenylephrine-APAP-Guaifenesin (VICKS SINEX SEVERE PO), Take 1 tablet by mouth at bedtime., Disp: , Rfl:  .  Pseudoeph-Doxylamine-DM-APAP (NYQUIL PO), Take by mouth at bedtime., Disp: , Rfl:  .  ranitidine (ZANTAC) 300 MG tablet, TAKE 1 TABLET BY MOUTH AT BEDTIME, Disp: 30 tablet, Rfl: 4  Review of Systems  Constitutional: Positive for appetite change, chills, diaphoresis, fatigue and fever. Negative for unexpected weight change.  HENT: Positive for congestion, dental problem, sneezing and sore throat. Negative for drooling, ear pain, facial swelling, hearing loss, mouth sores, trouble swallowing and voice change.   Eyes: Positive for pain, discharge, redness, itching and visual disturbance.  Respiratory: Positive for cough, shortness of breath and wheezing. Negative for choking.   Cardiovascular: Negative for chest pain, palpitations and leg swelling.  Gastrointestinal: Positive for constipation. Negative for abdominal pain, blood in stool, diarrhea and vomiting.  Endocrine: Positive for heat intolerance and polydipsia. Negative for cold intolerance.  Genitourinary: Negative for decreased  urine volume, dysuria and hematuria.  Musculoskeletal: Positive for arthralgias and back pain. Negative for gait problem.  Skin: Negative for rash.  Allergic/Immunologic: Positive for environmental allergies.  Neurological: Positive for headaches. Negative for seizures, syncope and light-headedness.  Hematological: Negative for adenopathy.  Psychiatric/Behavioral: Positive for dysphoric mood. Negative for agitation and suicidal ideas. The patient is nervous/anxious.     Per HPI unless specifically indicated above     Objective:    BP 135/84 (BP Location: Right Arm, Patient Position: Sitting, Cuff Size: Normal)   Pulse 99   Temp 98.1 F (36.7 C)   Ht 5\' 9"  (1.753 m)   Wt 215 lb (97.5 kg)   SpO2 96%   BMI 31.75 kg/m   Wt Readings from Last 3 Encounters:  08/15/17 215 lb (97.5 kg)  02/08/17 201 lb (91.2 kg)  01/30/17 197 lb 8 oz (89.6 kg)    Physical Exam  Constitutional: He is oriented to person, place, and time. He appears well-developed and well-nourished.  HENT:  Head: Normocephalic and atraumatic.  Neck: Neck supple.  Cardiovascular: Normal rate and regular rhythm.  Pulmonary/Chest: Effort normal and breath sounds normal. He has no wheezes.  Abdominal: Soft. Bowel sounds are normal. There is no hepatosplenomegaly. There is no tenderness.  Musculoskeletal: He exhibits no edema.  Lymphadenopathy:    He has no cervical adenopathy.  Neurological: He is alert and oriented to person, place, and time.  Skin: Skin is warm and dry.  Psychiatric: He has a normal mood and affect. His behavior is normal.  Vitals reviewed.  Assessment & Plan:    Encounter Diagnoses  Name Primary?  . Gastroesophageal reflux disease, esophagitis presence not specified Yes  . Cigarette nicotine dependence, uncomplicated   . Obesity, unspecified classification, unspecified obesity type, unspecified whether serious comorbidity present     -counseled smoking cessation -pt to continue  ranitidine for gerd -pt to follow up 1 year.  RTO sooner prn

## 2018-04-19 ENCOUNTER — Encounter: Payer: Self-pay | Admitting: Physician Assistant

## 2018-04-19 ENCOUNTER — Ambulatory Visit: Payer: Self-pay | Admitting: Physician Assistant

## 2018-04-19 VITALS — BP 116/86 | HR 102 | Temp 98.1°F | Ht 69.0 in | Wt 228.0 lb

## 2018-04-19 DIAGNOSIS — G8929 Other chronic pain: Secondary | ICD-10-CM

## 2018-04-19 DIAGNOSIS — M25562 Pain in left knee: Principal | ICD-10-CM

## 2018-04-19 DIAGNOSIS — M25561 Pain in right knee: Principal | ICD-10-CM

## 2018-04-19 MED ORDER — PREDNISONE 20 MG PO TABS: 20.0000 mg | ORAL_TABLET | Freq: Two times a day (BID) | ORAL | 0 refills | Status: DC

## 2018-04-19 NOTE — Progress Notes (Signed)
BP 116/86 (BP Location: Left Arm, Patient Position: Sitting, Cuff Size: Normal)   Pulse (!) 102   Temp 98.1 F (36.7 C)   Ht 5\' 9"  (1.753 m)   Wt 228 lb (103.4 kg)   SpO2 97%   BMI 33.67 kg/m    Subjective:    Patient ID: Lucas Dickerson, male    DOB: 16-Mar-1981, 37 y.o.   MRN: [id number]  HPI: Lucas Dickerson is a 37 y.o. male presenting on 04/19/2018 for Sleeping Problem   HPI   Knees hurt while sleeping.  X 3 week.  He sleeps on his side with a pillow between his knees.  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   Pain in calves and front of thighs and lower abdomen.  He says his knees have hurt him for years.   No recent injury    Relevant past medical, surgical, family and social history reviewed and updated as indicated. Interim medical history since our last visit reviewed. Allergies and medications reviewed and updated.   Current Outpatient Medications:  .  Acetaminophen (TYLENOL PO), Take 1-2 tablets by mouth as needed (Headache)., Disp: , Rfl:  .  Phenylephrine-APAP-Guaifenesin (VICKS SINEX SEVERE PO), Take 1 tablet by mouth at bedtime., Disp: , Rfl:  .  Pseudoeph-Doxylamine-DM-APAP (NYQUIL PO), Take by mouth at bedtime., Disp: , Rfl:  .  ranitidine (ZANTAC) 300 MG tablet, Take 1 tablet (300 mg total) by mouth at bedtime., Disp: 30 tablet, Rfl: 11   Review of Systems  Constitutional: Positive for chills, diaphoresis and fatigue. Negative for appetite change, fever and unexpected weight change.  HENT: Positive for congestion, dental problem, drooling, facial swelling and sneezing. Negative for ear pain, hearing loss, mouth sores, sore throat, trouble swallowing and voice change.   Eyes: Positive for pain, discharge, redness and itching. Negative for visual disturbance.  Respiratory: Positive for cough, choking, shortness of breath and wheezing.   Cardiovascular: Positive for leg swelling. Negative for chest pain and  palpitations.  Gastrointestinal: Positive for abdominal pain. Negative for blood in stool, constipation, diarrhea and vomiting.  Endocrine: Positive for cold intolerance, heat intolerance and polydipsia.  Genitourinary: Negative for decreased urine volume, dysuria and hematuria.  Musculoskeletal: Positive for arthralgias and back pain. Negative for gait problem.  Skin: Negative for rash.  Allergic/Immunologic: Positive for environmental allergies.  Neurological: Positive for headaches. Negative for seizures, syncope and light-headedness.  Hematological: Negative for adenopathy.  Psychiatric/Behavioral: Positive for agitation. Negative for dysphoric mood and suicidal ideas. The patient is nervous/anxious.     Per HPI unless specifically indicated above     Objective:    BP 116/86 (BP Location: Left Arm, Patient Position: Sitting, Cuff Size: Normal)   Pulse (!) 102   Temp 98.1 F (36.7 C)   Ht 5\' 9"  (1.753 m)   Wt 228 lb (103.4 kg)   SpO2 97%   BMI 33.67 kg/m   Wt Readings from Last 3 Encounters:  04/19/18 228 lb (103.4 kg)  08/15/17 215 lb (97.5 kg)  02/08/17 201 lb (91.2 kg)    Physical Exam  Constitutional: He is oriented to person, place, and time. He appears well-developed and well-nourished.  HENT:  Head: Normocephalic and atraumatic.  Neck: Neck supple.  Cardiovascular: Normal rate and regular rhythm.  Pulmonary/Chest: Effort normal and breath sounds normal. No respiratory distress. He has no wheezes.  Abdominal: Soft. Bowel sounds are normal. He exhibits no distension. There is no hepatosplenomegaly.  There is no tenderness.  Musculoskeletal: He exhibits no edema.       Right knee: He exhibits normal range of motion, no swelling, no effusion, no ecchymosis, no erythema and normal alignment. Tenderness found.       Left knee: He exhibits normal range of motion, no swelling, no effusion and no ecchymosis. Tenderness found.  Lymphadenopathy:    He has no cervical  adenopathy.  Neurological: He is alert and oriented to person, place, and time.  Skin: Skin is warm and dry.  Psychiatric: He has a normal mood and affect. His behavior is normal.  Nursing note and vitals reviewed.       Assessment & Plan:    Encounter Diagnosis  Name Primary?  . Chronic pain of both knees Yes    -will Xray knees in light of pain for years -pt was given [facility] -will treat with short course of Prednisone -ice/heat -pt to follow up as scheduled.  RTO sooner prn

## 2018-04-20 ENCOUNTER — Ambulatory Visit (HOSPITAL_COMMUNITY)
Admission: RE | Admit: 2018-04-20 | Discharge: 2018-04-20 | Disposition: A | Discharge status: Home / Self Care | Payer: Self-pay | Source: Ambulatory Visit | Attending: Physician Assistant | Admitting: Physician Assistant

## 2018-04-20 DIAGNOSIS — M25561 Pain in right knee: Secondary | ICD-10-CM | POA: Insufficient documentation

## 2018-04-20 DIAGNOSIS — M25562 Pain in left knee: Secondary | ICD-10-CM | POA: Insufficient documentation

## 2018-04-20 DIAGNOSIS — M170 Bilateral primary osteoarthritis of knee: Secondary | ICD-10-CM | POA: Insufficient documentation

## 2018-04-20 DIAGNOSIS — G8929 Other chronic pain: Secondary | ICD-10-CM | POA: Insufficient documentation

## 2018-07-19 ENCOUNTER — Ambulatory Visit: Payer: Self-pay | Admitting: Physician Assistant

## 2018-07-23 ENCOUNTER — Ambulatory Visit: Payer: Self-pay | Admitting: Physician Assistant

## 2018-07-23 ENCOUNTER — Encounter: Payer: Self-pay | Admitting: Physician Assistant

## 2018-07-23 VITALS — BP 130/90 | HR 84 | Temp 99.5°F | Ht 69.0 in | Wt 229.0 lb

## 2018-07-23 DIAGNOSIS — K219 Gastro-esophageal reflux disease without esophagitis: Secondary | ICD-10-CM

## 2018-07-23 DIAGNOSIS — M25561 Pain in right knee: Principal | ICD-10-CM

## 2018-07-23 DIAGNOSIS — M25562 Pain in left knee: Principal | ICD-10-CM

## 2018-07-23 DIAGNOSIS — F17200 Nicotine dependence, unspecified, uncomplicated: Secondary | ICD-10-CM

## 2018-07-23 DIAGNOSIS — J329 Chronic sinusitis, unspecified: Secondary | ICD-10-CM

## 2018-07-23 DIAGNOSIS — G8929 Other chronic pain: Secondary | ICD-10-CM

## 2018-07-23 MED ORDER — RANITIDINE HCL 300 MG PO TABS: 300.0000 mg | ORAL_TABLET | Freq: Every day | ORAL | 11 refills | Status: DC | PRN

## 2018-07-23 MED ORDER — AMOXICILLIN 500 MG PO CAPS: 500.0000 mg | ORAL_CAPSULE | Freq: Three times a day (TID) | ORAL | 0 refills | Status: DC

## 2018-07-23 NOTE — Patient Instructions (Signed)
Financial Counselor- [phone]  

## 2018-07-23 NOTE — Progress Notes (Signed)
BP 130/90 (BP Location: Left Arm, Patient Position: Sitting, Cuff Size: Normal)   Pulse 84   Temp 99.5 F (37.5 C)   Ht 5\' 9"  (1.753 m)   Wt 229 lb (103.9 kg)   SpO2 96%   BMI 33.82 kg/m    Subjective:    Patient ID: Lucas Dickerson, male    DOB: 06-10-1980, 38 y.o.   MRN: [id number]  HPI: Lucas Dickerson is a 38 y.o. male presenting on 07/23/2018 for Follow-up   HPI   Pt is still having pain bilateral knees.  Pt says prednisone that was prescribed for him at last OV helped his knees during the day with walking but not so much at night with sleeping.   Pt says he turned in his [facility] [employer] [facility] applciation but never heard anythin on it.    Pt also complains of swelling and pain below R eye that has been going on for several weeks.   Pt says the omeprazole recommended for him when ranitidine was recalled didn't help his reflux at all.   Relevant past medical, surgical, family and social history reviewed and updated as indicated. Interim medical history since our last visit reviewed. Allergies and medications reviewed and updated.   Current Outpatient Medications:  .  Acetaminophen (TYLENOL PO), Take 1-2 tablets by mouth as needed (Headache)., Disp: , Rfl:  .  diphenhydrAMINE HCl (BENADRYL ALLERGY PO), Take 3 tablets by mouth 2 (two) times daily., Disp: , Rfl:  .  Pseudoeph-Doxylamine-DM-APAP (NYQUIL PO), Take by mouth at bedtime., Disp: , Rfl:     Review of Systems  Constitutional: Positive for appetite change and diaphoresis. Negative for chills, fatigue, fever and unexpected weight change.  HENT: Positive for congestion, dental problem, drooling and facial swelling. Negative for ear pain, hearing loss, mouth sores, sneezing, sore throat, trouble swallowing and voice change.   Eyes: Positive for pain, discharge, redness and itching. Negative for visual disturbance.  Respiratory: Positive for cough, shortness of breath and wheezing. Negative for choking.   Cardiovascular:  Negative for chest pain, palpitations and leg swelling.  Gastrointestinal: Negative for abdominal pain, blood in stool, constipation, diarrhea and vomiting.  Endocrine: Positive for cold intolerance, heat intolerance and polydipsia.  Genitourinary: Negative for decreased urine volume, dysuria and hematuria.  Musculoskeletal: Positive for arthralgias and back pain. Negative for gait problem.  Skin: Negative for rash.  Allergic/Immunologic: Positive for environmental allergies.  Neurological: Positive for headaches. Negative for seizures, syncope and light-headedness.  Hematological: Negative for adenopathy.  Psychiatric/Behavioral: Positive for agitation and dysphoric mood. Negative for suicidal ideas. The patient is nervous/anxious.     Per HPI unless specifically indicated above     Objective:    BP 130/90 (BP Location: Left Arm, Patient Position: Sitting, Cuff Size: Normal)   Pulse 84   Temp 99.5 F (37.5 C)   Ht 5\' 9"  (1.753 m)   Wt 229 lb (103.9 kg)   SpO2 96%   BMI 33.82 kg/m   Wt Readings from Last 3 Encounters:  07/23/18 229 lb (103.9 kg)  04/19/18 228 lb (103.4 kg)  08/15/17 215 lb (97.5 kg)    Physical Exam Vitals signs reviewed.  Constitutional:      Appearance: He is well-developed.  HENT:     Head: Normocephalic and atraumatic.     Right Ear: Hearing, tympanic membrane, ear canal and external ear normal.     Left Ear: Hearing, tympanic membrane, ear canal and external ear normal.  Nose: Congestion present.     Right Sinus: Maxillary sinus tenderness present.     Mouth/Throat:     Pharynx: Uvula midline. No oropharyngeal exudate, posterior oropharyngeal erythema or uvula swelling.     Tonsils: No tonsillar abscesses.  Neck:     Musculoskeletal: Neck supple.  Cardiovascular:     Rate and Rhythm: Normal rate and regular rhythm.  Pulmonary:     Effort: Pulmonary effort is normal.     Breath sounds: Normal breath sounds. No wheezing.  Abdominal:      General: Bowel sounds are normal.     Palpations: Abdomen is soft.     Tenderness: There is no abdominal tenderness.  Musculoskeletal:     Right knee: He exhibits normal range of motion and no swelling. No tenderness found.     Left knee: He exhibits normal range of motion and no swelling. No tenderness found.  Lymphadenopathy:     Cervical: No cervical adenopathy.  Skin:    General: Skin is warm and dry.  Neurological:     Mental Status: He is alert and oriented to person, place, and time.  Psychiatric:        Behavior: Behavior normal.         Assessment & Plan:   Encounter Diagnoses  Name Primary?  . Chronic pain of both knees Yes  . Gastroesophageal reflux disease, esophagitis presence not specified   . Sinusitis, unspecified chronicity, unspecified location   . Tobacco use disorder     -reviewed xrays knees with pt -will Refer to orthopedics for knees -Pt to check on charity care application -rx ranitidine to [facility] for gerd -rx amoxil for sinusitis -counseled smoking cessation -pt to follow up 1 year.  RTO sooner prn

## 2018-07-30 ENCOUNTER — Encounter: Payer: Self-pay | Admitting: Physician Assistant

## 2018-08-16 ENCOUNTER — Ambulatory Visit: Payer: Self-pay | Admitting: Physician Assistant

## 2018-11-08 ENCOUNTER — Encounter: Payer: Self-pay | Admitting: Radiology

## 2018-11-28 ENCOUNTER — Telehealth: Payer: Self-pay | Admitting: Physician Assistant

## 2019-02-07 ENCOUNTER — Telehealth: Payer: Self-pay | Admitting: Physician Assistant

## 2019-07-24 ENCOUNTER — Ambulatory Visit: Payer: Self-pay | Admitting: Physician Assistant

## 2019-07-29 ENCOUNTER — Ambulatory Visit: Payer: Self-pay | Admitting: Physician Assistant

## 2019-07-30 ENCOUNTER — Encounter: Payer: Self-pay | Admitting: Physician Assistant

## 2019-07-30 ENCOUNTER — Other Ambulatory Visit: Payer: Self-pay

## 2019-07-30 ENCOUNTER — Ambulatory Visit: Payer: Self-pay | Admitting: Physician Assistant

## 2019-07-30 VITALS — BP 120/78 | HR 93 | Temp 99.7°F | Wt 228.0 lb

## 2019-07-30 DIAGNOSIS — F17200 Nicotine dependence, unspecified, uncomplicated: Secondary | ICD-10-CM

## 2019-07-30 DIAGNOSIS — K219 Gastro-esophageal reflux disease without esophagitis: Secondary | ICD-10-CM

## 2019-07-30 NOTE — Progress Notes (Signed)
BP 120/78   Pulse 93   Temp 99.7 F (37.6 C)   Wt 228 lb (103.4 kg)   SpO2 94%   BMI 33.67 kg/m    Subjective:    Patient ID: Lucas Dickerson, male    DOB: 1980/09/25, 39 y.o.   MRN: [id number]  HPI: Lucas Dickerson is a 39 y.o. male presenting on 07/30/2019 for No chief complaint on file.   HPI    This is a telemedicine appointment through [employer] due to coronavirus pandemic.    I connected with  Lucas Dickerson on 07/30/19 by a video enabled telemedicine application and verified that I am speaking with the correct person using two identifiers.   I discussed the limitations of evaluation and management by telemedicine. The patient expressed understanding and agreed to proceed.  Pt is at [facility] office.  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     Pt is 20yoM with appointment today for routine follow-up.  He says he has had a lot of stress recently.  Pt father died and he lost his job within the past month.  Pt says he is dealing with the stress and denies anxiety or depression  He says his Omeprazole is working okay for him.  Pt is still smoking.  He says he had mostly quit until the death of his father.  He hopes to stop smoking in the near future.  Sometimes hands tingle, almost always when playing games on his cell phone.    Relevant past medical, surgical, family and social history reviewed and updated as indicated. Interim medical history since our last visit reviewed. Allergies and medications reviewed and updated.    Current Outpatient Medications:  .  Acetaminophen (TYLENOL PO), Take 1-2 tablets by mouth as needed (Headache)., Disp: , Rfl:  .  diphenhydrAMINE HCl (BENADRYL ALLERGY PO), Take 3 tablets by mouth 2 (two) times daily., Disp: , Rfl:  .  OMEPRAZOLE PO, Take 1 tablet by mouth at bedtime., Disp: , Rfl:  .  Pseudoeph-Doxylamine-DM-APAP (NYQUIL PO), Take by mouth at bedtime., Disp: , Rfl:    Review of Systems  Per HPI unless specifically indicated  above     Objective:    BP 120/78   Pulse 93   Temp 99.7 F (37.6 C)   Wt 228 lb (103.4 kg)   SpO2 94%   BMI 33.67 kg/m   Wt Readings from Last 3 Encounters:  07/30/19 228 lb (103.4 kg)  07/23/18 229 lb (103.9 kg)  04/19/18 228 lb (103.4 kg)    Physical Exam Vitals reviewed.  Constitutional:      General: He is not in acute distress.    Appearance: Normal appearance. He is not ill-appearing.  HENT:     Head: Normocephalic and atraumatic.  Pulmonary:     Effort: Pulmonary effort is normal. No respiratory distress.  Musculoskeletal:     Right wrist: No swelling or deformity. Normal range of motion.     Left wrist: No swelling or deformity. Normal range of motion.     Right hand: No swelling or deformity. Normal range of motion.     Left hand: No swelling or deformity. Normal range of motion.  Neurological:     Mental Status: He is alert and oriented to person, place, and time.  Psychiatric:        Attention and Perception: Attention normal.        Mood and Affect: Mood normal.        Speech:  Speech normal.        Behavior: Behavior normal. Behavior is cooperative.           Assessment & Plan:   Encounter Diagnoses  Name Primary?  . Gastroesophageal reflux disease, unspecified whether esophagitis present Yes  . Tobacco use disorder     -counseled pt on smoking cessation -discussed with pt limiting game playing on his cell phone.  Discussed that if he starts having problems with tingling in his hands at time when he is not playing games, he should RTO -pt to continue omeprazole as needed.  Encouraged pt to follow lifestyle changes to help manage his symptoms -Pt to follow up 1 year.  He is to contact office sooner prn

## 2020-07-29 ENCOUNTER — Encounter: Payer: Self-pay | Admitting: Physician Assistant

## 2020-07-29 ENCOUNTER — Ambulatory Visit: Payer: Self-pay | Admitting: Physician Assistant

## 2020-07-29 DIAGNOSIS — F17200 Nicotine dependence, unspecified, uncomplicated: Secondary | ICD-10-CM

## 2020-07-29 DIAGNOSIS — Z0000 Encounter for general adult medical examination without abnormal findings: Secondary | ICD-10-CM

## 2020-07-29 DIAGNOSIS — K219 Gastro-esophageal reflux disease without esophagitis: Secondary | ICD-10-CM

## 2020-07-29 NOTE — Progress Notes (Signed)
   There were no vitals taken for this visit.   Subjective:    Patient ID: Lucas Dickerson, male    DOB: 1980-12-03, 40 y.o.   MRN: [id number]  HPI: Lucas Dickerson is a 40 y.o. male presenting on 07/29/2020 for No chief complaint on file.   HPI   This is a telemedicine appointment through [employer] due to coronavirus pandemic.  Pt was scheduled for in-person appointment but would not wear a mask covering his mouth and nose so was changed to a virtual appointment.  I connected with  Lucas Dickerson on 07/29/20 by a video enabled telemedicine application and verified that I am speaking with the correct person using two identifiers.   I discussed the limitations of evaluation and management by telemedicine. The patient expressed understanding and agreed to proceed.  Pt is in his parked car.  Provider is at office.    Pt is 39yoM with appointment for annual follow up.  He has no complaints today.  He is Still smoking but trying to quit  He has Not gotten the covid vaccination but has appt next week to get it  He is Working at [employer].     Relevant past medical, surgical, family and social history reviewed and updated as indicated. Interim medical history since our last visit reviewed. Allergies and medications reviewed and updated.   Current Outpatient Medications:  .  Acetaminophen (TYLENOL PO), Take 1-2 tablets by mouth as needed (Headache)., Disp: , Rfl:  .  diphenhydrAMINE HCl (BENADRYL ALLERGY PO), Take 3 tablets by mouth 2 (two) times daily., Disp: , Rfl:  .  Levocetirizine Dihydrochloride (XYZAL PO), Take by mouth., Disp: , Rfl:  .  OMEPRAZOLE PO, Take 1 tablet by mouth at bedtime., Disp: , Rfl:      Review of Systems  Per HPI unless specifically indicated above     Objective:    There were no vitals taken for this visit.  Wt Readings from Last 3 Encounters:  07/30/19 228 lb (103.4 kg)  07/23/18 229 lb (103.9 kg)  04/19/18 228 lb (103.4 kg)    Physical  Exam Constitutional:      General: He is not in acute distress.    Appearance: He is not toxic-appearing.  HENT:     Head: Normocephalic and atraumatic.  Pulmonary:     Effort: Pulmonary effort is normal. No respiratory distress.  Neurological:     Mental Status: He is alert and oriented to person, place, and time.  Psychiatric:        Attention and Perception: Attention normal.        Speech: Speech normal.        Behavior: Behavior is cooperative.             Assessment & Plan:    Encounter Diagnoses  Name Primary?  . Encounter for annual health examination Yes  . Tobacco use disorder   . Gastroesophageal reflux disease, unspecified whether esophagitis present   \  -No labs needed at this time -counseled Smoking cessation -encouraged pt to get covid vaccination next week as scheduled -pt to follow up 1 year.  He is to contact office sooner prn

## 2020-10-22 ENCOUNTER — Emergency Department (HOSPITAL_COMMUNITY)
Admission: EM | Admit: 2020-10-22 | Discharge: 2020-10-22 | Disposition: A | Discharge status: Home / Self Care | Payer: Self-pay | Attending: Emergency Medicine | Admitting: Emergency Medicine

## 2020-10-22 ENCOUNTER — Encounter (HOSPITAL_COMMUNITY): Payer: Self-pay | Admitting: Pharmacy Technician

## 2020-10-22 DIAGNOSIS — K219 Gastro-esophageal reflux disease without esophagitis: Secondary | ICD-10-CM | POA: Insufficient documentation

## 2020-10-22 DIAGNOSIS — F17200 Nicotine dependence, unspecified, uncomplicated: Secondary | ICD-10-CM | POA: Insufficient documentation

## 2020-10-22 DIAGNOSIS — K5900 Constipation, unspecified: Secondary | ICD-10-CM | POA: Insufficient documentation

## 2020-10-22 LAB — COMPREHENSIVE METABOLIC PANEL
ALT: 18 U/L (ref 0–44)
AST: 26 U/L (ref 15–41)
Albumin: 4.4 g/dL (ref 3.5–5.0)
Alkaline Phosphatase: 71 U/L (ref 38–126)
Anion gap: 11 (ref 5–15)
BUN: 12 mg/dL (ref 6–20)
CO2: 22 mmol/L (ref 22–32)
Calcium: 9.5 mg/dL (ref 8.9–10.3)
Chloride: 102 mmol/L (ref 98–111)
Creatinine, Ser: 1.15 mg/dL (ref 0.61–1.24)
GFR, Estimated: >60 mL/min (ref >60)
Glucose, Bld: 122 mg/dL — ABNORMAL HIGH (ref 70–99)
Potassium: 3.8 mmol/L (ref 3.5–5.1)
Sodium: 135 mmol/L (ref 135–145)
Total Bilirubin: 1.6 mg/dL — ABNORMAL HIGH (ref 0.3–1.2)
Total Protein: 7.4 g/dL (ref 6.5–8.1)

## 2020-10-22 LAB — CBC
HCT: 49.2 % (ref 39.0–52.0)
Hemoglobin: 16.6 g/dL (ref 13.0–17.0)
MCH: 28.8 pg (ref 26.0–34.0)
MCHC: 33.7 g/dL (ref 30.0–36.0)
MCV: 85.4 fL (ref 80.0–100.0)
Platelets: 364 10*3/uL (ref 150–400)
RBC: 5.76 MIL/uL (ref 4.22–5.81)
RDW: 13.3 % (ref 11.5–15.5)
WBC: 15.8 10*3/uL — ABNORMAL HIGH (ref 4.0–10.5)
nRBC: 0 % (ref 0.0–0.2)

## 2020-10-22 LAB — LIPASE, BLOOD: Lipase: 29 U/L (ref 11–51)

## 2020-10-22 MED ORDER — POLYETHYLENE GLYCOL 3350 17 G PO PACK: 17.0000 g | PACK | Freq: Every day | ORAL | 0 refills | Status: DC

## 2020-10-22 MED ORDER — SENNA 8.6 MG PO TABS
2.0000 | ORAL_TABLET | Freq: Once | ORAL | Status: AC
  Administered 2020-10-22: 17.2 mg via ORAL
  Filled 2020-10-22: qty 2

## 2020-10-22 MED ORDER — POLYETHYLENE GLYCOL 3350 17 G PO PACK
17.0000 g | PACK | Freq: Once | ORAL | Status: AC
  Administered 2020-10-22: 17 g via ORAL
  Filled 2020-10-22: qty 1

## 2020-10-22 NOTE — ED Provider Notes (Signed)
[facility] EMERGENCY DEPARTMENT Provider Note   CSN: [id number] Arrival date & time: 10/22/20  0947     History Chief Complaint  Patient presents with  . Abdominal Pain  . Constipation    Lucas Dickerson is a 40 y.o. male with GERD, chronic leukocytosis, tobacco use disorder who presents for evaluation of abdominal pain. Patient states that he has been having regular daily bowel movements with his most recent bowel movement on the evening of 10/20/20. Patient states that since then, he has felt very constipated and spent 3.5 hours on the toilet attempting to have a bowel movement and straining without passing of stool. He recently started a new job three days ago and states that this constipation has impacted his ability to work. He denies nausea, vomiting, abdominal pain, fevers, chills or any associated symptoms. Patient has no prior history of recent episodes of constipation or previous abdominal surgery. He states that he drinks plenty of fluids but could eat more fiber.    Past Medical History:  Diagnosis Date  . Cataract   . Glaucoma    Patient Active Problem List   Diagnosis Date Noted  . Gastroesophageal reflux disease 08/15/2017  . Cigarette nicotine dependence, uncomplicated 07/19/2015  . Leukocytosis 04/14/2015    Past Surgical History:  Procedure Laterality Date  . EYE SURGERY      Family History  Problem Relation Age of Onset  . Heart disease Mother   . Hypertension Mother   . Diabetes Mother   . Diabetes Father   . Hypertension Father   . Diabetes Brother   . Hypertension Brother     Social History   Tobacco Use  . Smoking status: Current Every Day Smoker    Packs/day: 0.50    Years: 18.00    Pack years: 9.00  . Smokeless tobacco: Former Neurosurgeon    Types: Snuff  . Tobacco comment: has cut down to 1/2 ppd  Substance Use Topics  . Alcohol use: No  . Drug use: No    Home Medications Prior to Admission medications   Medication  Sig Start Date End Date Taking? Authorizing Provider  polyethylene glycol (MIRALAX / GLYCOLAX) 17 g packet Take 17 g by mouth daily. 10/22/20  Yes Roylene Reason, MD  Acetaminophen (TYLENOL PO) Take 1-2 tablets by mouth as needed (Headache).    [provider]  diphenhydrAMINE HCl (BENADRYL ALLERGY PO) Take 3 tablets by mouth 2 (two) times daily.    [provider]  Levocetirizine Dihydrochloride (XYZAL PO) Take by mouth.    [provider]  OMEPRAZOLE PO Take 1 tablet by mouth at bedtime.    [provider]    Allergies    Poison oak extract [poison oak extract]  Review of Systems   Review of Systems  Constitutional: Negative for chills and fever.  HENT: Negative for ear pain and sore throat.   Eyes: Negative for pain and visual disturbance.  Respiratory: Negative for cough and shortness of breath.   Cardiovascular: Negative for chest pain and palpitations.  Gastrointestinal: Positive for constipation. Negative for abdominal pain and vomiting.  Genitourinary: Negative for dysuria and hematuria.  Musculoskeletal: Negative for arthralgias and back pain.  Skin: Negative for color change and rash.  Neurological: Negative for seizures and syncope.  All other systems reviewed and are negative.   Physical Exam Updated Vital Signs BP (!) 149/104 (BP Location: Left Arm)   Pulse (!) 101   Temp 98.5 F (36.9 C) (Oral)  Resp 16   SpO2 97%   Physical Exam Vitals and nursing note reviewed.  Constitutional:      Appearance: He is well-developed.  HENT:     Head: Normocephalic and atraumatic.  Eyes:     Conjunctiva/sclera: Conjunctivae normal.  Cardiovascular:     Rate and Rhythm: Normal rate and regular rhythm.     Heart sounds: No murmur heard.   Pulmonary:     Effort: Pulmonary effort is normal. No respiratory distress.     Breath sounds: Normal breath sounds.  Abdominal:     General: Bowel sounds are normal.     Palpations: Abdomen is  soft.     Tenderness: There is no abdominal tenderness.  Musculoskeletal:     Cervical back: Neck supple.  Skin:    General: Skin is warm and dry.  Neurological:     Mental Status: He is alert.    ED Results / Procedures / Treatments   Labs (all labs ordered are listed, but only abnormal results are displayed) Labs Reviewed  COMPREHENSIVE METABOLIC PANEL - Abnormal; Notable for the following components:      Result Value   Glucose, Bld 122 (*)    Total Bilirubin 1.6 (*)    All other components within normal limits  CBC - Abnormal; Notable for the following components:   WBC 15.8 (*)    All other components within normal limits  LIPASE, BLOOD  URINALYSIS, ROUTINE W REFLEX MICROSCOPIC   EKG None  Radiology No results found.  Medications Ordered in ED Medications  polyethylene glycol (MIRALAX / GLYCOLAX) packet 17 g (has no administration in time range)  senna (SENOKOT) tablet 17.2 mg (has no administration in time range)    ED Course  I have reviewed the triage vital signs and the nursing notes.  Pertinent labs & imaging results that were available during my care of the patient were reviewed by me and considered in my medical decision making (see chart for details).    MDM Rules/Calculators/A&P                          Patient presenting with 36 hour history of constipation. Patient has not attempted oral laxatives or suppositories/enemas. Patient is mildly hypertensive and tachycardic likely secondary to discomfort from his constipation. Initial labs do reveal a mild leukocytosis although this is chronic for patient. He has underwent evaluation by oncology for this finding which has been attributed to ongoing tobacco use.  Patient would benefit from initiation of a bowel regimen. He should be able to partake in this bowel regimen independently as he has no functional limitations. Provided patient with one dose of Miralax, two tablets of senna and instructed patient to  pick up additional Miralax from his pharmacy of choosing. Patient understands and agrees with plan and has no further questions or concerns.  Final Clinical Impression(s) / ED Diagnoses Final diagnoses:  Constipation, unspecified constipation type   Rx / DC Orders ED Discharge Orders         Ordered    polyethylene glycol (MIRALAX / GLYCOLAX) 17 g packet  Daily        10/22/20 1237           Roylene Reason, MD 10/22/20 1237    Melene Plan, DO 10/22/20 1248

## 2020-10-22 NOTE — Discharge Instructions (Signed)
Mr. Lucas Dickerson,  It was a pleasure having the opportunity to take care of you in the emergency department. For your constipation, I strongly recommend that you pick up Miralax from a pharmacy and take this medication daily which will help you have a bowel movement. We have provided a dose of Miralax and Senna while you were here in the emergency department. If your symptoms don't improve with the medication by mouth, you can take a suppository or enema which you can also pick up from a pharmacy.  Sincerely, Dr. Jasmine December, MD

## 2020-10-22 NOTE — ED Triage Notes (Signed)
Pt here with reports of lower abdominal pain onset yesterday. Reports he feels constipated. LBM 2 days ago. Denies nausea/vomiting.

## 2021-05-15 ENCOUNTER — Emergency Department (HOSPITAL_COMMUNITY)
Admission: EM | Admit: 2021-05-15 | Discharge: 2021-05-15 | Disposition: A | Discharge status: Home / Self Care | Payer: Self-pay | Attending: Emergency Medicine | Admitting: Emergency Medicine

## 2021-05-15 ENCOUNTER — Encounter (HOSPITAL_COMMUNITY): Payer: Self-pay | Admitting: *Deleted

## 2021-05-15 ENCOUNTER — Emergency Department (HOSPITAL_COMMUNITY): Payer: Self-pay

## 2021-05-15 DIAGNOSIS — W010XXA Fall on same level from slipping, tripping and stumbling without subsequent striking against object, initial encounter: Secondary | ICD-10-CM | POA: Insufficient documentation

## 2021-05-15 DIAGNOSIS — S52125A Nondisplaced fracture of head of left radius, initial encounter for closed fracture: Secondary | ICD-10-CM | POA: Insufficient documentation

## 2021-05-15 DIAGNOSIS — Y92019 Unspecified place in single-family (private) house as the place of occurrence of the external cause: Secondary | ICD-10-CM | POA: Insufficient documentation

## 2021-05-15 DIAGNOSIS — F17210 Nicotine dependence, cigarettes, uncomplicated: Secondary | ICD-10-CM | POA: Insufficient documentation

## 2021-05-15 MED ORDER — OXYCODONE-ACETAMINOPHEN 5-325 MG PO TABS: 1.0000 | ORAL_TABLET | Freq: Three times a day (TID) | ORAL | 0 refills | Status: AC | PRN

## 2021-05-15 MED ORDER — OXYCODONE-ACETAMINOPHEN 5-325 MG PO TABS: 1.0000 | ORAL_TABLET | Freq: Three times a day (TID) | ORAL | 0 refills | Status: DC | PRN

## 2021-05-15 NOTE — ED Provider Notes (Signed)
[facility] EMERGENCY DEPARTMENT Provider Note   CSN: [id number] Arrival date & time: 05/15/21  1703     History Chief Complaint  Patient presents with   Elbow Injury    Lucas Dickerson is a 40 y.o. male.  HPI  Patient with no medical history presented with complaints of left elbow pain.  Patient is today while he was at his friend's house working he slipped causing fall backwards he landed directly onto his left elbow, he denies hitting his head, losing conscious, is not on anticoagulant he states he had pain in his left elbow but as time went on the pain is gotten worse, states he goes slightly down and up his arm, he denies any paresthesias in his fingers, able to move his fingers and wrist without difficulty, has slight difficulty bending at his elbow due to pain and is able to move it without difficulty.  He denies  neck pain, back pain, chest pain, abdominal pain, pain in his other 3 extremities he has no other complaints this time.  He states took some ibuprofen not much relief.  Past Medical History:  Diagnosis Date   Cataract    Glaucoma     Patient Active Problem List   Diagnosis Date Noted   Gastroesophageal reflux disease 08/15/2017   Cigarette nicotine dependence, uncomplicated 07/19/2015   Leukocytosis 04/14/2015    Past Surgical History:  Procedure Laterality Date   EYE SURGERY         Family History  Problem Relation Age of Onset   Heart disease Mother    Hypertension Mother    Diabetes Mother    Diabetes Father    Hypertension Father    Diabetes Brother    Hypertension Brother     Social History   Tobacco Use   Smoking status: Every Day    Packs/day: 0.50    Years: 18.00    Pack years: 9.00    Types: Cigarettes   Smokeless tobacco: Former    Types: Snuff   Tobacco comments:    has cut down to 1/2 ppd  Substance Use Topics   Alcohol use: No   Drug use: No    Home Medications Prior to Admission medications   Medication Sig Start  Date End Date Taking? Authorizing Provider  Acetaminophen (TYLENOL PO) Take 1-2 tablets by mouth as needed (Headache).    [provider]  diphenhydrAMINE HCl (BENADRYL ALLERGY PO) Take 3 tablets by mouth 2 (two) times daily.    [provider]  Levocetirizine Dihydrochloride (XYZAL PO) Take by mouth.    [provider]  OMEPRAZOLE PO Take 1 tablet by mouth at bedtime.    [provider]  polyethylene glycol (MIRALAX / GLYCOLAX) 17 g packet Take 17 g by mouth daily. 10/22/20   Roylene Reason, MD    Allergies    Poison oak extract [poison oak extract]  Review of Systems   Review of Systems  Constitutional:  Negative for chills and fever.  HENT:  Negative for congestion.   Respiratory:  Negative for shortness of breath.   Cardiovascular:  Negative for chest pain.  Gastrointestinal:  Negative for abdominal pain.  Genitourinary:  Negative for enuresis.  Musculoskeletal:  Negative for back pain.       Left elbow pain.  Skin:  Negative for rash.  Neurological:  Negative for dizziness.  Hematological:  Does not bruise/bleed easily.   Physical Exam Updated Vital Signs BP (!) 138/99   Pulse 94  Temp 97.9 F (36.6 C) (Oral)   Resp 20   SpO2 97%   Physical Exam Vitals and nursing note reviewed.  Constitutional:      General: He is not in acute distress.    Appearance: Normal appearance. He is not ill-appearing or diaphoretic.  HENT:     Head: Normocephalic and atraumatic.     Nose: No congestion or rhinorrhea.  Eyes:     Conjunctiva/sclera: Conjunctivae normal.  Cardiovascular:     Rate and Rhythm: Normal rate and regular rhythm.     Pulses: Normal pulses.  Pulmonary:     Effort: Pulmonary effort is normal.  Musculoskeletal:     Cervical back: Neck supple.     Comments: Left arm was visualized no acute abnormalities present, slightly tender to palpation along the olecranon, he has full range of motion in his fingers, wrist, limited range  of flexion extension at the elbow, full range of motion at the left shoulder, left arm is neurovascular intact.  Spine was palpated is nontender to palpation, he is moving all other extremities without difficulty.  Skin:    General: Skin is warm and dry.     Coloration: Skin is not jaundiced or pale.  Neurological:     Mental Status: He is alert.  Psychiatric:        Mood and Affect: Mood normal.    ED Results / Procedures / Treatments   Labs (all labs ordered are listed, but only abnormal results are displayed) Labs Reviewed - No data to display  EKG None  Radiology DG Elbow Complete Left  Result Date: 05/15/2021 CLINICAL DATA:  Left elbow pain after fall the EXAM: LEFT ELBOW - COMPLETE 3+ VIEW COMPARISON:  None. FINDINGS: Acute nondisplaced fracture of the radial head with articular surface disruption. No additional fractures identified. No dislocation. Elbow joint hemarthrosis. Soft tissues within normal limits. IMPRESSION: Acute nondisplaced radial head fracture. Electronically Signed   By: Duanne Guess D.O.   On: 05/15/2021 18:09    Procedures Procedures   Medications Ordered in ED Medications - No data to display  ED Course  I have reviewed the triage vital signs and the nursing notes.  Pertinent labs & imaging results that were available during my care of the patient were reviewed by me and considered in my medical decision making (see chart for details).    MDM Rules/Calculators/A&P                          Initial impression-presents with left elbow pain, alert, no acute stress, vital signs reassuring.  Triage obtained imaging.  Work-up-imaging reveals acute nondisplaced radial head fracture.  Rule out- I have low suspicion for septic arthritis as patient denies IV drug use, skin exam was performed no erythematous, edematous, warm joints noted on exam.  Low suspicion for dislocation as x-ray does not feel any significant findings. low suspicion for ligament or  tendon damage as area was palpated no gross defects noted has only slightly decreased range of motion.  This like secondary due to pain.  Low suspicion for compartment syndrome as area was palpated it was soft to the touch, neurovascular fully intact.   Plan-  Left radial fracture-placed in a splint will remain in flexion, will give him pain medications, have him follow-up with orthopedic surgery in a week's time for reevaluation.  Given strict return precautions.  Vital signs have remained stable, no indication for hospital admission.    Patient  given at home care as well strict return precautions.  Patient verbalized that they understood agreed to said plan.  Final Clinical Impression(s) / ED Diagnoses Final diagnoses:  Closed nondisplaced fracture of head of left radius, initial encounter    Rx / DC Orders ED Discharge Orders     None        Barnie Del 05/15/21 1919    Benjiman Core, MD 05/16/21 0006

## 2021-05-15 NOTE — ED Triage Notes (Signed)
Fell , pain in left elbow

## 2021-05-15 NOTE — Discharge Instructions (Signed)
I have placed you in a sling please wear, recommend over-the-counter pain medication as needed, keep the arm elevated when not in use, may apply ice to the area this help decrease pain and inflammation. I have given you a short course of narcotics please take as prescribed.  This medication can make you drowsy do not consume alcohol or operate heavy machinery when taking this medication.  This medication is Tylenol in it do not take Tylenol and take this medication.   Please call orthopedic surgery on Monday to schedule a follow-up appointment.  Come back to the emergency department if you develop chest pain, shortness of breath, severe abdominal pain, uncontrolled nausea, vomiting, diarrhea.

## 2021-05-18 ENCOUNTER — Ambulatory Visit (INDEPENDENT_AMBULATORY_CARE_PROVIDER_SITE_OTHER): Payer: Self-pay | Admitting: Orthopedic Surgery

## 2021-05-18 ENCOUNTER — Other Ambulatory Visit: Payer: Self-pay

## 2021-05-18 ENCOUNTER — Encounter: Payer: Self-pay | Admitting: Orthopedic Surgery

## 2021-05-18 VITALS — Ht 69.0 in | Wt 243.0 lb

## 2021-05-18 DIAGNOSIS — S52125A Nondisplaced fracture of head of left radius, initial encounter for closed fracture: Secondary | ICD-10-CM

## 2021-05-18 DIAGNOSIS — W010XXA Fall on same level from slipping, tripping and stumbling without subsequent striking against object, initial encounter: Secondary | ICD-10-CM

## 2021-05-18 MED ORDER — HYDROCODONE-ACETAMINOPHEN 5-325 MG PO TABS: 1.0000 | ORAL_TABLET | Freq: Four times a day (QID) | ORAL | 0 refills | Status: DC | PRN

## 2021-05-19 ENCOUNTER — Encounter: Payer: Self-pay | Admitting: Orthopedic Surgery

## 2021-05-19 NOTE — Progress Notes (Signed)
New Patient Visit  Assessment: Lucas Dickerson is a 40 y.o. male with the following: 1. Closed nondisplaced fracture of head of left radius, initial encounter  Plan: Left radial head fracture.  Minimally displaced.  Advised him to come out of the sling within the next 2 days, and start working on gentle range of motion.  Biggest concern with his injury is stiffness.  He stated his understanding.  We will see him back in approximately 4 weeks for repeat evaluation.   Follow-up: Return in about 4 weeks (around 06/15/2021).  Subjective:  Chief Complaint  Patient presents with   Fracture    Lt elbow fx DOI 05/15/21    History of Present Illness: Lucas Dickerson is a 40 y.o. LHD male who presents for evaluation of left elbow pain.  Just few days ago, he was helping a friend do some work at his house, when he slipped and fell.  He landed directly on his left elbow.  He presented to the emergency department, was noted to have a left elbow fracture.  He was placed in a sling.  No injuries elsewhere.  He did not hit his head.  He has been taking some medication since the fall.  He has remained in the sling.  It hurts to move, especially pronation and supination.  Pain does radiate distally from his elbow to his thumb.  No numbness and tingling.   Review of Systems: No fevers or chills No numbness or tingling No chest pain No shortness of breath No bowel or bladder dysfunction No GI distress No headaches   Medical History:  Past Medical History:  Diagnosis Date   Cataract    Glaucoma     Past Surgical History:  Procedure Laterality Date   EYE SURGERY      Family History  Problem Relation Age of Onset   Heart disease Mother    Hypertension Mother    Diabetes Mother    Diabetes Father    Hypertension Father    Diabetes Brother    Hypertension Brother    Social History   Tobacco Use   Smoking status: Every Day    Packs/day: 0.50    Years: 18.00    Pack years: 9.00     Types: Cigarettes   Smokeless tobacco: Former    Types: Snuff   Tobacco comments:    has cut down to 1/2 ppd  Substance Use Topics   Alcohol use: No   Drug use: No    Allergies  Allergen Reactions   Poison Oak Extract [Poison Oak Extract] Itching and Rash    Current Meds  Medication Sig   HYDROcodone-acetaminophen (NORCO/VICODIN) 5-325 MG tablet Take 1 tablet by mouth every 6 (six) hours as needed for moderate pain.    Objective: Ht 5\' 9"  (1.753 m)    Wt 243 lb (110.2 kg)    BMI 35.88 kg/m   Physical Exam:  General: Alert and oriented. and No acute distress. Gait: Normal gait.  Evaluation left elbow demonstrates mild swelling.  Mild ecchymosis is appreciated.  Tenderness palpation directly over the radial head.  Actively, he can get 45 degrees of supination and 30 degrees of pronation.  He is most comfortable at approximately 90 degrees of flexion.  Fingers are warm and well-perfused.  2+ DP pulse.  Sensation is intact throughout the left hand.   IMAGING: I personally reviewed images previously obtained from the ED  X-rays of the left elbow from the emergency department demonstrate a  minimally displaced fracture of the left radial head.  No other injuries are noted.  No dislocation.   New Medications:  Meds ordered this encounter  Medications   HYDROcodone-acetaminophen (NORCO/VICODIN) 5-325 MG tablet    Sig: Take 1 tablet by mouth every 6 (six) hours as needed for moderate pain.    Dispense:  15 tablet    Refill:  0      Oliver Barre, MD  05/19/2021 12:16 AM

## 2021-06-15 ENCOUNTER — Ambulatory Visit (INDEPENDENT_AMBULATORY_CARE_PROVIDER_SITE_OTHER): Payer: Self-pay | Admitting: Orthopedic Surgery

## 2021-06-15 ENCOUNTER — Other Ambulatory Visit: Payer: Self-pay

## 2021-06-15 ENCOUNTER — Encounter: Payer: Self-pay | Admitting: Orthopedic Surgery

## 2021-06-15 ENCOUNTER — Ambulatory Visit: Payer: Self-pay

## 2021-06-15 DIAGNOSIS — S52125D Nondisplaced fracture of head of left radius, subsequent encounter for closed fracture with routine healing: Secondary | ICD-10-CM

## 2021-06-15 MED ORDER — IBUPROFEN 600 MG PO TABS: 600.0000 mg | ORAL_TABLET | Freq: Three times a day (TID) | ORAL | 1 refills | Status: DC | PRN

## 2021-06-15 NOTE — Progress Notes (Signed)
New Patient Visit  Assessment: Lucas Dickerson is a 41 y.o. male with the following: 1. Closed nondisplaced fracture of head of left radius, subsequent encounter  Plan: Radiographs stable He has no pain on physical exam. Full supination and pronation. Flexion to 120 degrees.  Able to get to his mouth easily. 20 degrees short of extension. Provided him with a knee sleeve, which states fits well on his left elbow.  He will continue to wear for a dedicated elbow sleeve. Discussed exercises in order to improve his extension. Provided a prescription for Motrin.  Continue with medications as needed. Follow-up as needed.  Follow-up: Return if symptoms worsen or fail to improve.  Subjective:  Chief Complaint  Patient presents with   Elbow Injury    Left elbow, DOI 05-15-21.    History of Present Illness: Lucas Dickerson is a 41 y.o. LHD male who presents for repeat evaluation of left elbow pain.  He sustained a fall, approximate 1 month ago.  He remained in a sling for approximately 1 week following his initial visit.  Since then, he is gradually started using his left arm more.  He notes occasional pain in the left elbow.  He has been taking medications at night to help him sleep.  He only has difficulty with extension of his left elbow.  He is trying to find a compression sleeve for his left elbow, but has been unsuccessful.  No numbness or tingling.  No issues otherwise.   Review of Systems: No fevers or chills No numbness or tingling No chest pain No shortness of breath No bowel or bladder dysfunction No GI distress No headaches  Objective: There were no vitals taken for this visit.  Physical Exam:  General: Alert and oriented. and No acute distress. Gait: Normal gait.  Evaluation left elbow demonstrates no swelling.  No bruising is appreciated.  No tenderness to palpation over the radial head.  Flexion to 120 degrees without discomfort.  20 degrees short of full extension.   He has full pronation and supination.  Fingers are warm and well-perfused.  2+ radial pulse.   IMAGING: I personally reviewed images previously obtained from the ED  X-ray of the left elbow was obtained in clinic today.  Minimally displaced radial head fracture, without interval displacement.  No loose bodies within the joint.  No additional injuries are noted.  No acute injuries.  Impression: Healing left radial head fracture, in acceptable alignment.  New Medications:  Meds ordered this encounter  Medications   ibuprofen (ADVIL) 600 MG tablet    Sig: Take 1 tablet (600 mg total) by mouth every 8 (eight) hours as needed.    Dispense:  30 tablet    Refill:  1      Oliver Barre, MD  06/15/2021 11:35 AM

## 2021-06-15 NOTE — Patient Instructions (Addendum)
Continue to work on range of motion and strengthening  Allow arm to hang in extension to improve range of motion, as we discussed in clinic.   Anticipate gradual improvements in your range of motion  Increase strength as tolerated.   Call with issues

## 2021-07-26 ENCOUNTER — Ambulatory Visit: Payer: Self-pay | Admitting: Physician Assistant

## 2022-06-18 ENCOUNTER — Other Ambulatory Visit: Payer: Self-pay

## 2022-06-18 ENCOUNTER — Encounter (HOSPITAL_COMMUNITY): Payer: Self-pay | Admitting: *Deleted

## 2022-06-18 ENCOUNTER — Emergency Department (HOSPITAL_COMMUNITY)
Admission: EM | Admit: 2022-06-18 | Discharge: 2022-06-18 | Disposition: A | Discharge status: Home / Self Care | Payer: BC Managed Care – PPO | Attending: Emergency Medicine | Admitting: Emergency Medicine

## 2022-06-18 DIAGNOSIS — M545 Low back pain, unspecified: Secondary | ICD-10-CM | POA: Insufficient documentation

## 2022-06-18 DIAGNOSIS — R202 Paresthesia of skin: Secondary | ICD-10-CM | POA: Insufficient documentation

## 2022-06-18 MED ORDER — LIDOCAINE 5 % EX PTCH
2.0000 | MEDICATED_PATCH | CUTANEOUS | Status: DC
  Administered 2022-06-18: 2 via TRANSDERMAL
  Filled 2022-06-18: qty 2

## 2022-06-18 MED ORDER — LIDOCAINE 5 % EX PTCH: 1.0000 | MEDICATED_PATCH | CUTANEOUS | 0 refills | Status: DC

## 2022-06-18 MED ORDER — IBUPROFEN 400 MG PO TABS
600.0000 mg | ORAL_TABLET | Freq: Once | ORAL | Status: AC
  Administered 2022-06-18: 600 mg via ORAL
  Filled 2022-06-18: qty 2

## 2022-06-18 NOTE — ED Provider Notes (Signed)
[facility] Provider Note   CSN: [id number] Arrival date & time: 06/18/22  4098     History  Chief Complaint  Patient presents with   Back Pain   HPI Lucas Dickerson is a 42 y.o. male presenting for back pain.  Started yesterday.  Patient is a [employer].  Knelt down to check the tire pressure on a car and 20 minutes later he started to feel pain in his lower back.  Pain is all over the lower back just above the belt line.  Denies saddle paresthesia, urinary or bowel incontinence.  Denies trauma and fever.  Also mention that this morning he started to have tingling in both his feet which lasted for 20 minutes. The tingling has since subsided.  Patient stated that his dad had similar symptoms a few years ago and ended up being something wrong with his kidneys requiring hospitalization.  This was concerning to him and ultimately prompted his evaluation today in the emergency department.  Denies trauma.   Back Pain      Home Medications Prior to Admission medications   Medication Sig Start Date End Date Taking? Authorizing Provider  lidocaine (LIDODERM) 5 % Place 1 patch onto the skin daily. Remove & Discard patch within 12 hours or as directed by MD 06/18/22  Yes Harriet Pho, PA-C  Acetaminophen (TYLENOL PO) Take 1-2 tablets by mouth as needed (Headache).    [provider]  diphenhydrAMINE HCl (BENADRYL ALLERGY PO) Take 3 tablets by mouth 2 (two) times daily.    [provider]  HYDROcodone-acetaminophen (NORCO/VICODIN) 5-325 MG tablet Take 1 tablet by mouth every 6 (six) hours as needed for moderate pain. 05/18/21   Mordecai Rasmussen, MD  ibuprofen (ADVIL) 600 MG tablet Take 1 tablet (600 mg total) by mouth every 8 (eight) hours as needed. 06/15/21   Mordecai Rasmussen, MD  Levocetirizine Dihydrochloride (XYZAL PO) Take by mouth.    [provider]  OMEPRAZOLE PO Take 1 tablet by mouth at bedtime.    [provider]  polyethylene  glycol (MIRALAX / GLYCOLAX) 17 g packet Take 17 g by mouth daily. 10/22/20   Cato Mulligan, MD      Allergies    Poison oak extract [poison oak extract]    Review of Systems   Review of Systems  Musculoskeletal:  Positive for back pain.    Physical Exam   Vitals:   06/18/22 1043  BP: (!) 150/98  Pulse: 70  Resp: 20  Temp: 98.2 F (36.8 C)  SpO2: 99%    CONSTITUTIONAL:  well-appearing, NAD NEURO:  GCS 15. Speech is goal oriented. No deficits appreciated to CN III-XII; symmetric eyebrow raise, no facial drooping, tongue midline. Patient has equal grip strength bilaterally with 5/5 strength against resistance in all major muscle groups bilaterally. Sensation to light touch intact. Patient moves extremities without ataxia. Normal finger-nose-finger. Patient ambulatory with steady gait. EYES:  eyes equal and reactive ENT/NECK:  Supple, no stridor  PULM:  No respiratory distress GI/GU:  non-distended, soft, no CVA tenderness MSK/SPINE:  No gross deformities, no edema, moves all extremities.  Normal range of motion of the back without tenderness.  No midline lumbar tenderness. SKIN:  no rash, atraumatic   *Additional and/or pertinent findings included in MDM below    ED Results / Procedures / Treatments   Labs (all labs ordered are listed, but only abnormal results are displayed) Labs Reviewed - No data to display  EKG  None  Radiology No results found.  Procedures Procedures    Medications Ordered in ED Medications  ibuprofen (ADVIL) tablet 600 mg (has no administration in time range)  lidocaine (LIDODERM) 5 % 2 patch (has no administration in time range)    ED Course/ Medical Decision Making/ A&P                             Medical Decision Making  42 year old who is well-appearing and hemodynamically stable presenting for lower back pain.  Physical exam was unremarkable.  Differential diagnosis for this complaint includes soft tissue injury of the lower  back, pyelonephritis and nephrolithiasis, cauda equina syndrome, and stroke and diabetic neuropathy.  Doubt pyelonephritis and nephrolithiasis given no urinary changes and no flank tenderness.  Doubt cauda equina syndrome given no red flag symptoms.  Doubt stroke given tingling is bilateral and has since subsided.  Cannot rule out diabetic neuropathy for his bilateral feet tingling along with B12 deficiency.  Advised that he follow-up with his primary care provider.  Treat his lower back pain with Lidoderm patch and ibuprofen.  Discussed conservative treatment at home and follow-up with PCP.  Sent Lidoderm patches to his pharmacy.        Final Clinical Impression(s) / ED Diagnoses Final diagnoses:  Low back pain, unspecified back pain laterality, unspecified chronicity, unspecified whether sciatica present    Rx / DC Orders ED Discharge Orders          Ordered    lidocaine (LIDODERM) 5 %  Every 24 hours        06/18/22 1105              Lindell Spar 06/18/22 1105    Hayden Rasmussen, MD 06/18/22 1740

## 2022-06-18 NOTE — ED Triage Notes (Addendum)
Pt c/o lower back pain that started yesterday. Pt reports he knelt down yesterday to check the tire pressure on a car and then about 20 minutes later he started to feel the pain. This morning he went to lift lawn chairs and felt a tingling feeling in both his feet. Since then it has been intermittent.

## 2022-06-18 NOTE — Discharge Instructions (Addendum)
Evaluation of your lower back pain is overall reassuring.  Recommend that you continue conservative treatment at home.  You can use ibuprofen as needed for inflammation and pain.  Would recommend that you use it sparingly as it can be irritating to the stomach.  Also I sent Lidoderm patches to your pharmacy. This can also help with pain related to your lower back.  Please follow-up with your PCP for ongoing management of your lower back pain.

## 2022-08-04 ENCOUNTER — Encounter: Payer: Self-pay | Admitting: Radiology

## 2023-03-09 IMAGING — DX DG ELBOW COMPLETE 3+V*L*
4 series · 4 of 4 positions shown · non-contrast
Comparison: None.

CLINICAL DATA: Left elbow pain after fall the

EXAM:
LEFT ELBOW - COMPLETE 3+ VIEW

[elbow ap]
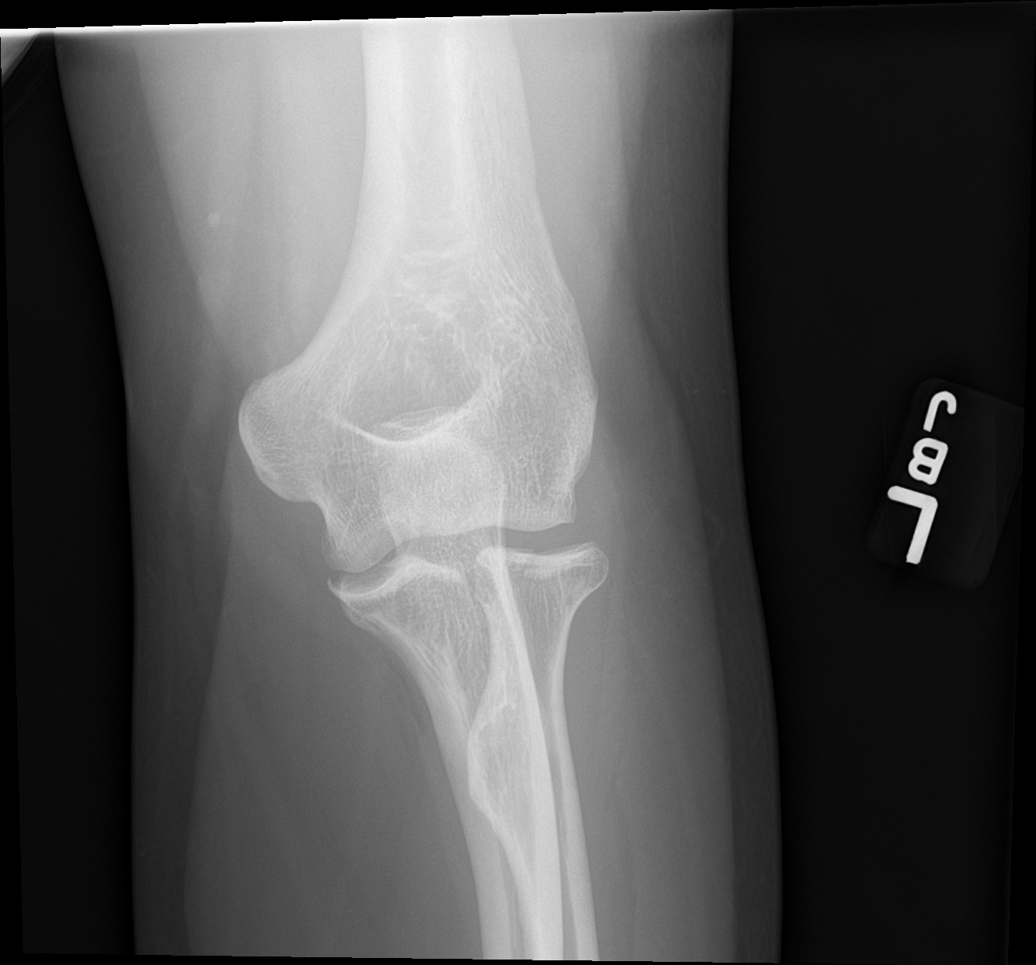

[elbow obl (1 of 2)]
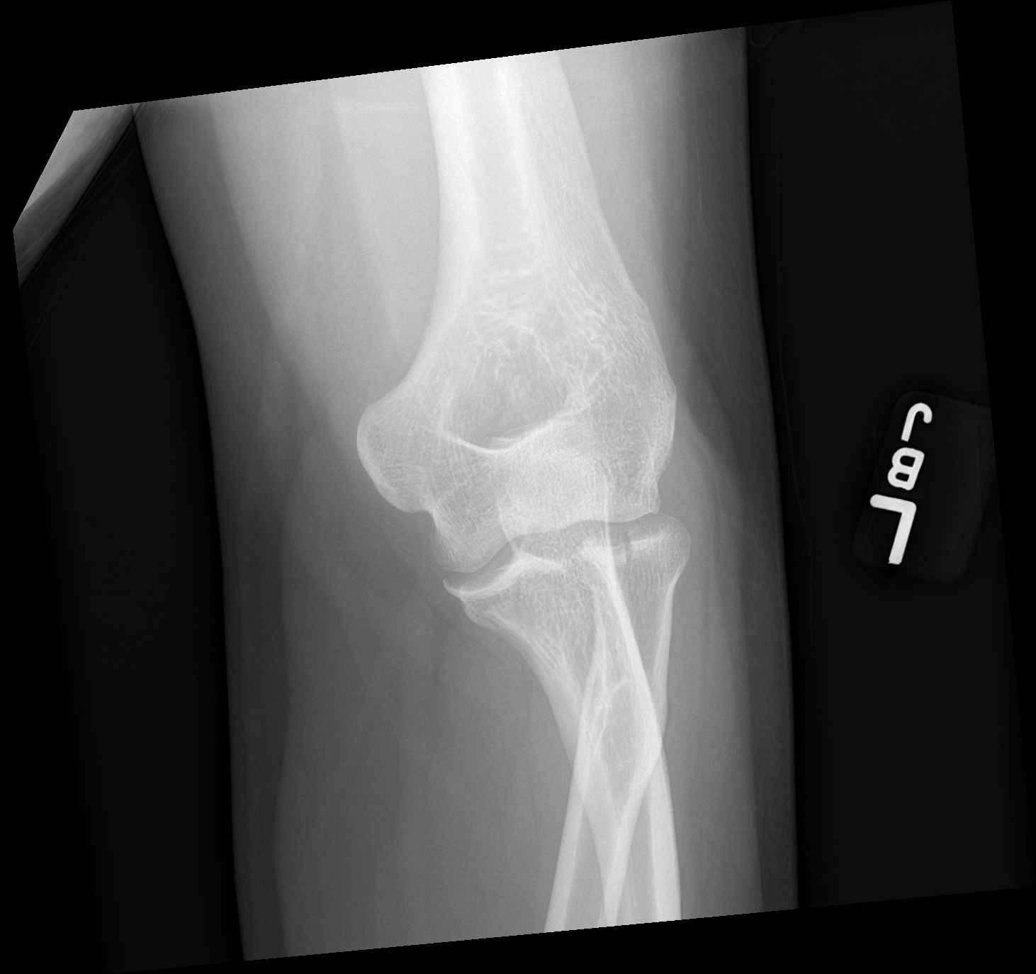

[elbow lat]
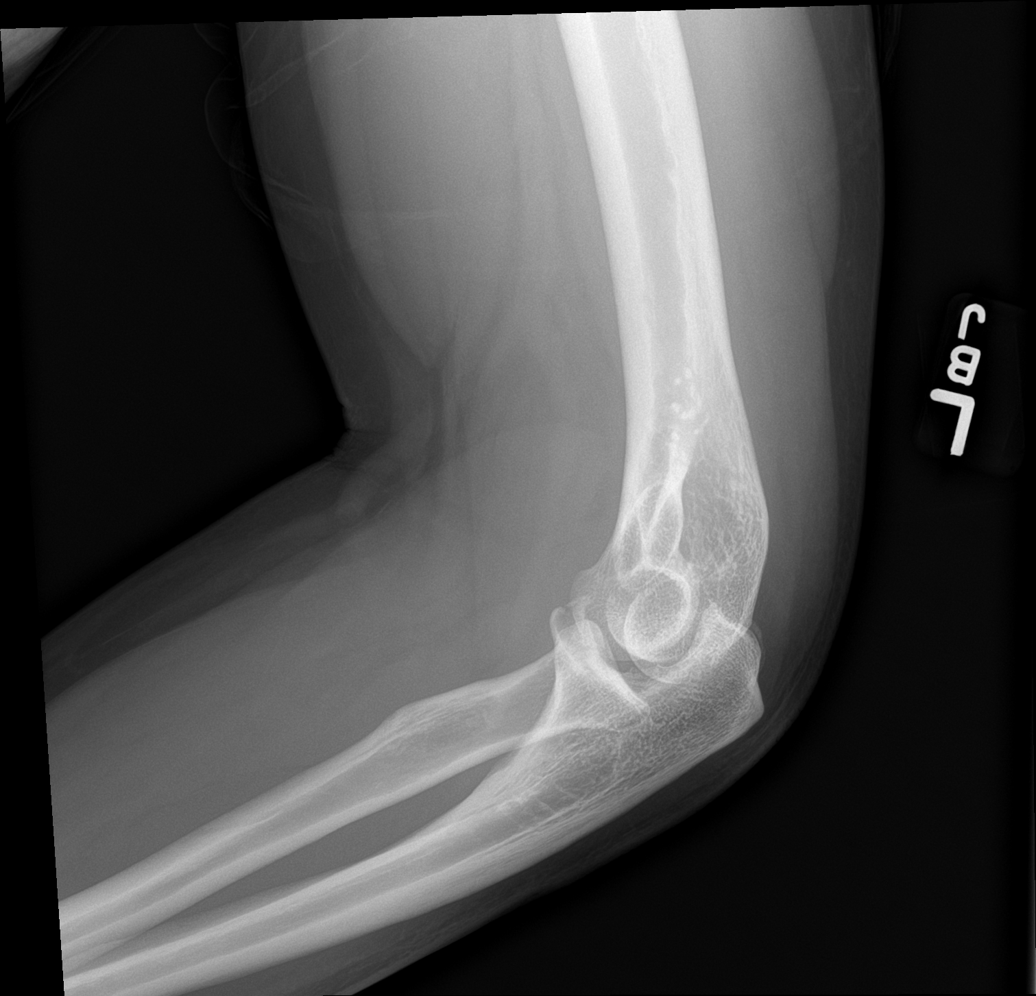

[elbow obl (2 of 2)]
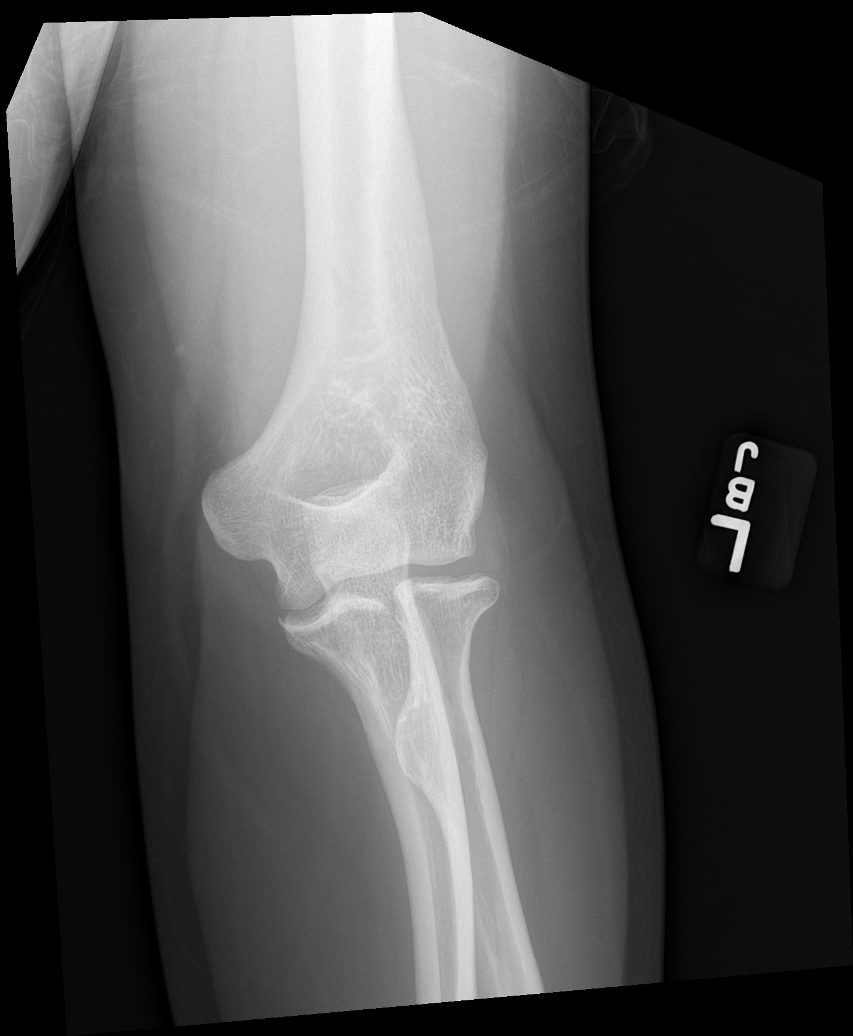

[4 of 4 positions shown; findings below may reference images not displayed]

FINDINGS: Acute nondisplaced fracture of the radial head with articular
surface disruption. No additional fractures identified. No
dislocation. Elbow joint hemarthrosis. Soft tissues within normal
limits.
IMPRESSION: Acute nondisplaced radial head fracture.

## 2023-04-05 NOTE — Congregational Nurse Program (Unsigned)
Dept: [phone]   Congregational Nurse Program Note  Date of Encounter: 04/05/2023  Past Medical History: Past Medical History:  Diagnosis Date   Cataract    Glaucoma     Encounter Details:  Community Questionnaire - 04/05/23 1733       Questionnaire   Ask client: Do you give verbal consent for me to treat you today? Yes    Student Assistance N/A    Location Patient Served  [facility]    Encounter Setting CN site    Population Status Unknown    Insurance Uninsured ([employer]/[facility]s/Self-Pay/Medicaid Family Planning)    Insurance/Financial Assistance Referral [employer];Medicaid    Medication N/A    Medical Provider No    Screening Referrals Made N/A    Medical Referrals Made Non-[facility] P[facility]/Clinic;[facility]    Medical Appointment Completed N/A    CNP Interventions Advocate/Support;Navigate Healthcare System;Case Management;Counsel;Educate    Screenings CN Performed Blood Pressure;Blood Glucose;Weight    ED Visit Averted N/A    Life-Saving Intervention Made N/A           1645 04/05/23 Former Care [facility] client in today to re-enroll in [facility]. At one time had health insurance and [facility], but now works at [employer] at [employer] and confirmed no longer has insurance.  Vital signs today 158/97 left arm, sitting, normal cuff. Pulse 76, respirations 18 not labored.Temp orally 98.7. Random glucose by fingerstick 105. Wt 251 lbs HT 5'9"   Past Medical History; Glaucoma  Cataracts Blindness Right eye/ all since childbirth  Multiple eyes surgeries as a child  Last eye exam for Glaucoma was last year with Dr. Dione Booze. Eyeglass change a year ago. No Covid vaccines  Medications  None prescribed currently Takes OTC acid reliever states doesn't work.  Brief screening today to re-enroll client. 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 He lives with his mother and brother which he reports is extremely  stressful as they continuously argue and expect "me to do everything and fix everything and I'm the only one working".  He states he is looking to move to his own place once he can find a place he can afford.   He is Alert and oriented and answers appropriately, appears stressed, very talkative and engages. Focus returns to the stress he is feeling from his mother and brother and those stressors of living there. He Denies chest pains or shortness of breath  today, but does state if he feels over stressed he does get pressure and feels breathing "pressed" until he can slow down and get calmer or out of that situation. He does report headaches, but denies them today. Gait is steady by observation. He does wear glasses and last changed last year and his last exam with Dr Dione Booze in [place] last year.(Unknown date).  His other concerns today is that he thinks he may have some "arthritis" in his left hand, he reports his middle 2-3 fingers , palm to wrist have a dull ache that increases with movement. Denies pain during interview.  Speech clear, denies any real changes in his vision.  He also complains of a rash that he reports as tiny bumps that don't drain, but itch on top of feet and heels. He says he has had some areas on his elbow also.  Vital signs as above.  He is a smoker and smokes a pack per day, we discussed his elevated blood pressure today and that smoking  contributes to this. He states it is the only thing that he has to help with his stress right now. We discussed it puts him at higher risk for stroke, heart attack and affects his circulation and his lungs. He denies cough. We discussed decreasing salt intake and reading labels and discussed the salt that adds up in already prepared processed foods. Will provided with DASH diet information, stress reduction information. He states that he feels the stress could be contributing to his blood pressure. We did discuss symptoms that would necessitate  emergency care, chest pain, shortness of breath, difficulty with speech, loss of balance or loss of strength or control of extremities, facial weakness.   Last seen at [facility] in 07/29/20 after getting insurance. [place] confirmed by [employer] no longer has BCBS.  I discussed that finding options for decreasing his stress would benefit his overall health. We discussed that [facility] a [facility] and that he should talk to the provider to determine if that would be an option for him.   SDOH Screening: Food/ currently he doesn't have a need for food,but states he may need some when he is able to find a place to move out. [employer] copy discussed and given a Facilities manager for other food pantries.  Housing/Utilities: currently he is responsible for his own bills, vehicle , car insurance, gas food, cell. He is working full time and hopes to maybe get an increase in pay once he locates a place of his own.  He checked doesn't feel safe where he lives but upon further discussion it is he isn't unsafe as harm, but the stress and constant arguing and relying on him for everything adds more stress that he feels isn't good. He states sometimes he will just go stay with a friend for a night or so to get away from the environment. Will search for possible rentals with criteria he provided staff.   PHQ9 score = 17 1). Little interest or pleasure doing things = 2 ( states his brother complains to him if he goes out with friends) 2). Feeling down or depressed or hopeless= 2 ( always goes back to stress at home) 3). Trouble falling or staying asleep or sleeping too much = 3 4) feeling tired or having little energy = 2 5) poor appetite or eating too much = 1 ( states overeats)  6) feeling bad about yourself or that you are a failure or have let yourself down = 2 7). Trouble concentrating on things such as reading the newspaper or watching tv = 3 8) moving or speaking so slow that others notice or  opposite being fidgety or restless =1 9) feelings of better off dead or hurting himself= 1 ( he denies an SI today/ states fleeting thoughts of maybe be better just "not being around" denies ever having an plan or of acting on random thoughts, again attributes to the stress of his home life.  Client would like to re-establish care with the [facility], he is aware of location and hours. Appointment made for 04/10/23 at 0800 so he doesn't have to miss a lot of work. Appointment time and date provided to him in writing along with his Care Connect card. MedAssist was sent by [facility] for review and approval.  Plan: will follow up after his next appointment and also refer to [facility] interns to follow up and offer possible  Help searching for rentals, he does not want an apartment due to his  dog which is a husky and he doesn't want to live in the city and any other support.    Francee Nodal RN [employer]

## 2023-04-10 ENCOUNTER — Ambulatory Visit: Payer: Self-pay | Admitting: Physician Assistant

## 2023-04-10 ENCOUNTER — Other Ambulatory Visit: Payer: Self-pay | Admitting: Physician Assistant

## 2023-04-10 ENCOUNTER — Encounter: Payer: Self-pay | Admitting: Physician Assistant

## 2023-04-10 ENCOUNTER — Telehealth: Payer: Self-pay

## 2023-04-10 VITALS — BP 149/103 | HR 91 | Temp 97.9°F | Wt 256.0 lb

## 2023-04-10 DIAGNOSIS — K219 Gastro-esophageal reflux disease without esophagitis: Secondary | ICD-10-CM

## 2023-04-10 DIAGNOSIS — F17200 Nicotine dependence, unspecified, uncomplicated: Secondary | ICD-10-CM

## 2023-04-10 DIAGNOSIS — Z13220 Encounter for screening for lipoid disorders: Secondary | ICD-10-CM

## 2023-04-10 DIAGNOSIS — R079 Chest pain, unspecified: Secondary | ICD-10-CM

## 2023-04-10 DIAGNOSIS — Z2821 Immunization not carried out because of patient refusal: Secondary | ICD-10-CM

## 2023-04-10 DIAGNOSIS — F439 Reaction to severe stress, unspecified: Secondary | ICD-10-CM

## 2023-04-10 DIAGNOSIS — Z7689 Persons encountering health services in other specified circumstances: Secondary | ICD-10-CM

## 2023-04-10 DIAGNOSIS — I10 Essential (primary) hypertension: Secondary | ICD-10-CM

## 2023-04-10 DIAGNOSIS — Z131 Encounter for screening for diabetes mellitus: Secondary | ICD-10-CM

## 2023-04-10 DIAGNOSIS — E66812 Obesity, class 2: Secondary | ICD-10-CM

## 2023-04-10 DIAGNOSIS — R7309 Other abnormal glucose: Secondary | ICD-10-CM

## 2023-04-10 DIAGNOSIS — M79642 Pain in left hand: Secondary | ICD-10-CM

## 2023-04-10 DIAGNOSIS — M79641 Pain in right hand: Secondary | ICD-10-CM

## 2023-04-10 MED ORDER — LOSARTAN POTASSIUM 50 MG PO TABS: 50.0000 mg | ORAL_TABLET | Freq: Every day | ORAL | 1 refills | Status: DC

## 2023-04-10 MED ORDER — OMEPRAZOLE 40 MG PO CPDR: 40.0000 mg | DELAYED_RELEASE_CAPSULE | Freq: Every day | ORAL | 0 refills | Status: DC

## 2023-04-10 MED ORDER — LOSARTAN POTASSIUM 50 MG PO TABS: 50.0000 mg | ORAL_TABLET | Freq: Every day | ORAL | 0 refills | Status: DC

## 2023-04-10 NOTE — Progress Notes (Signed)
BP (!) 149/103   Pulse 91   Temp 97.9 F (36.6 C)   Wt 256 lb (116.1 kg)   SpO2 99%   BMI 37.80 kg/m    Subjective:    Patient ID: Lucas Dickerson, male    DOB: 1981/02/11, 42 y.o.   MRN: [id number]  HPI: Lucas Dickerson is a 42 y.o. male presenting on 04/10/2023 for New Patient (Initial Visit)   HPI  Pt is 42yoM who is in today to re-establish care.  He was Last here 2022.  He has had no PCP since that time.      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    He says his Reflux has its good days and bad days.  He says his Stress eating makes reflux worse.  He has gained at least 25 pounds this year.  He says he is Stressed due to living arrangements.  He is Living with his mom and his brother.  He is looking for another place but this is made difficult by his limited finances and that he has a husky/shepard mix.    He is having some issues with his hands.  3rd and 4th fingers hurting bilatal.  Sometimes tingling.    He Feels like pressure on his chest when he's stressed.    He says he only gets sob when exerting.  With his chest pressure he sometimes gets diaphoretic but denies nausea.   Pt father died of MI at age 76.   Relevant past medical, surgical, family and social history reviewed and updated as indicated. Interim medical history since our last visit reviewed. Allergies and medications reviewed and updated.   Current Outpatient Medications:    OMEPRAZOLE PO, Take 1 tablet by mouth at bedtime., Disp: , Rfl:    Pseudoeph-Doxylamine-DM-APAP (NYQUIL PO), Take by mouth., Disp: , Rfl:      Review of Systems  Per HPI unless specifically indicated above     Objective:    BP (!) 149/103   Pulse 91   Temp 97.9 F (36.6 C)   Wt 256 lb (116.1 kg)   SpO2 99%   BMI 37.80 kg/m   Wt Readings from Last 3 Encounters:  04/10/23 256 lb (116.1 kg)  04/05/23 251 lb (113.9 kg)  06/18/22 230 lb (104.3 kg)    Physical Exam Vitals reviewed.  Constitutional:       General: He is not in acute distress.    Appearance: He is well-developed. He is obese. He is not toxic-appearing.  HENT:     Head: Normocephalic and atraumatic.  Cardiovascular:     Rate and Rhythm: Normal rate and regular rhythm.  Pulmonary:     Effort: Pulmonary effort is normal. No respiratory distress.     Breath sounds: Normal breath sounds. No wheezing, rhonchi or rales.  Chest:     Chest wall: No tenderness.  Abdominal:     General: Bowel sounds are normal.     Palpations: Abdomen is soft.     Tenderness: There is no abdominal tenderness.  Musculoskeletal:     Right wrist: Normal.     Left wrist: Normal.     Right hand: Normal. No swelling, deformity, tenderness or bony tenderness. Normal range of motion. Normal strength. Normal pulse.     Left hand: Normal. No swelling, deformity, tenderness or bony tenderness. Normal range of motion. Normal strength. Normal pulse.     Cervical back: Neck supple.     Right  lower leg: No edema.     Left lower leg: No edema.  Lymphadenopathy:     Cervical: No cervical adenopathy.  Skin:    General: Skin is warm and dry.  Neurological:     Mental Status: He is alert and oriented to person, place, and time.  Psychiatric:        Behavior: Behavior normal.    EKG NSR at 82bpm.  No st-t changes. No previous for comparison          Assessment & Plan:    Encounter Diagnoses  Name Primary?   Encounter to establish care Yes   Primary hypertension    Chest pain, unspecified type    Stress at home    Gastroesophageal reflux disease, unspecified whether esophagitis present    Screening for hyperlipidemia    Elevated glucose    Screening for diabetes mellitus    Tobacco use disorder    Class 2 obesity with body mass index (BMI) of 37.0 to 37.9 in adult, unspecified obesity type, unspecified whether serious comorbidity present    COVID-19 vaccination declined    Pain in both hands      CP -Likely due to anxiety but pt has risk  factors for cad including family history, htn, smoking, obesity.  Pt will need to see cardiology to evaluate for cad.   -Will update labs- cmp, lipids, A1c, tsh  HTN -rx losartan.  Pt is counseled to start medication today  GERD -increase omeprazole to 40mg  daily -this could be contributing to cp -it has worsened due to stress eating and weight gain  Stress/anxiety/depression -pt will be scheduled to see [facility]  Uninsured -pt is given application for [facility] financial assistance  HCM -pt is given Flu shot voucher -pt declines covid vaccination  Hand pain -will look into this further at next OV   Pt is to follow up in one month to recheck bp and review labs and evaluate hand pain.  He is to contact office sooner prn worsening or new symptoms

## 2023-04-10 NOTE — Telephone Encounter (Signed)
Called to follow up- after establishing care with [facility] today, He states everything went well and that he is scheduled to see [facility] and has a follow up. He is aware of his labs to be drawn. He was prescribed Losartan and has picked that up today and will begin as was instructed by provider.  He states he is being referred to Cardiologist also dues to his chest pressure and cardiac family history and his history of smoking.  Francee Nodal RN  [facility] [employer]/[facility]  Will follow up again next week.

## 2023-04-17 ENCOUNTER — Other Ambulatory Visit (HOSPITAL_COMMUNITY)
Admission: RE | Admit: 2023-04-17 | Discharge: 2023-04-17 | Disposition: A | Discharge status: Home / Self Care | Payer: Self-pay | Source: Ambulatory Visit | Attending: Physician Assistant | Admitting: Physician Assistant

## 2023-04-17 DIAGNOSIS — I10 Essential (primary) hypertension: Secondary | ICD-10-CM | POA: Insufficient documentation

## 2023-04-17 DIAGNOSIS — F439 Reaction to severe stress, unspecified: Secondary | ICD-10-CM | POA: Insufficient documentation

## 2023-04-17 DIAGNOSIS — R7309 Other abnormal glucose: Secondary | ICD-10-CM | POA: Insufficient documentation

## 2023-04-17 DIAGNOSIS — Z131 Encounter for screening for diabetes mellitus: Secondary | ICD-10-CM | POA: Insufficient documentation

## 2023-04-17 DIAGNOSIS — Z13220 Encounter for screening for lipoid disorders: Secondary | ICD-10-CM | POA: Insufficient documentation

## 2023-04-17 DIAGNOSIS — R079 Chest pain, unspecified: Secondary | ICD-10-CM | POA: Insufficient documentation

## 2023-04-17 DIAGNOSIS — K219 Gastro-esophageal reflux disease without esophagitis: Secondary | ICD-10-CM | POA: Insufficient documentation

## 2023-04-17 LAB — COMPREHENSIVE METABOLIC PANEL
ALT: 13 U/L (ref 0–44)
AST: 16 U/L (ref 15–41)
Albumin: 3.9 g/dL (ref 3.5–5.0)
Alkaline Phosphatase: 72 U/L (ref 38–126)
Anion gap: 10 (ref 5–15)
BUN: 14 mg/dL (ref 6–20)
CO2: 25 mmol/L (ref 22–32)
Calcium: 9 mg/dL (ref 8.9–10.3)
Chloride: 103 mmol/L (ref 98–111)
Creatinine, Ser: 0.96 mg/dL (ref 0.61–1.24)
GFR, Estimated: >60 mL/min (ref >60)
Glucose, Bld: 115 mg/dL — ABNORMAL HIGH (ref 70–99)
Potassium: 4 mmol/L (ref 3.5–5.1)
Sodium: 138 mmol/L (ref 135–145)
Total Bilirubin: 0.4 mg/dL (ref <1.2)
Total Protein: 7.1 g/dL (ref 6.5–8.1)

## 2023-04-17 LAB — HEMOGLOBIN A1C
Hgb A1c MFr Bld: 5.5 % (ref 4.8–5.6)
Mean Plasma Glucose: 111.15 mg/dL

## 2023-04-17 LAB — LIPID PANEL
Cholesterol: 171 mg/dL (ref 0–200)
HDL: 41 mg/dL (ref >40)
LDL Cholesterol: 111 mg/dL — ABNORMAL HIGH (ref 0–99)
Total CHOL/HDL Ratio: 4.2 {ratio}
Triglycerides: 94 mg/dL (ref <150)
VLDL: 19 mg/dL (ref 0–40)

## 2023-04-17 LAB — TSH: TSH: 0.714 u[IU]/mL (ref 0.350–4.500)

## 2023-05-08 ENCOUNTER — Ambulatory Visit: Payer: Self-pay | Admitting: Physician Assistant

## 2023-05-09 ENCOUNTER — Ambulatory Visit: Payer: Self-pay | Admitting: Physician Assistant

## 2023-05-09 ENCOUNTER — Ambulatory Visit: Payer: Self-pay

## 2023-05-23 ENCOUNTER — Encounter: Payer: Self-pay | Admitting: Physician Assistant

## 2023-05-23 ENCOUNTER — Ambulatory Visit: Payer: Self-pay | Admitting: Physician Assistant

## 2023-05-23 ENCOUNTER — Ambulatory Visit: Payer: Self-pay

## 2023-05-23 VITALS — BP 140/96 | HR 77 | Temp 98.0°F | Wt 251.0 lb

## 2023-05-23 DIAGNOSIS — F439 Reaction to severe stress, unspecified: Secondary | ICD-10-CM

## 2023-05-23 DIAGNOSIS — M79641 Pain in right hand: Secondary | ICD-10-CM

## 2023-05-23 DIAGNOSIS — F17200 Nicotine dependence, unspecified, uncomplicated: Secondary | ICD-10-CM

## 2023-05-23 DIAGNOSIS — I10 Essential (primary) hypertension: Secondary | ICD-10-CM
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
## 2023-05-23 NOTE — Progress Notes (Signed)
This is session # 1 with Lucas Dickerson  for individual behavioral health services for anxiety and depression. Patient presents alone. BHP spent 60 minutes with patient.    Patient is presenting with anxious symptoms including autonomic hyperactivity, racing thoughts, irritability, and feelings of overwhelm, and depressive symptoms including feeling stuck, hopelessness, and negative thought patterns. Symptoms are related to grief, interpersonal and environmental stressors, and limited supports. Patient will likely benefit from continued behavioral health services with strengths-based intervention and coping skill development.   This procedure has been fully reviewed with the patient and informed consent has been obtained.   Patient location: Pt seen in clinic via telehealth.   I connected with Lucas Dickerson on 05/23/23 by a video enabled telemedicine application and verified that I am speaking with the correct person.   I discussed the limitations of evaluation and management by telemedicine. The patient expressed understanding and agreed to proceed.     ASSESSMENT AND PLAN   Current diagnosis:  anxiety and depression   We created the following treatment plan at today's appointment:               1)  05/23/23               2)  Decrease anxious and depressive symptoms by increasing awareness, coping skills, self-care, supports, and overall level of lie satisfaction and well-being.                3)  Medication management discussed with Provider and pt declined at this time.               4)  Next screening due in 6 sessions.     HISTORY OF PRESENT ILLNESS   Mental Status: Pt presented oriented X4. Mood and affect were anxious and depressed. Judgment was appropriate. Memory was appropriate. Thought processes and content were appropriate. Patient denies SI/HI.   On 05/23/23, GAD-7 score of 16 and PHQ-9 score of 18 recorded by Provider.    Narrative: Met with pt for scheduled  appointment. Person-centered approach used to begin therapeutic alliance. Explored presenting concerns, sx, stressors, hx, and goals. Normalized concerns and provided space for appropriate venting of frustrations. Created plan to increase coping and distress tolerance skills, and explore options for practical changes in living situation. Assessed for any other concerns pt wanted to address today, which they denied. Scheduled follow up.   Effectiveness of the intervention(s) and the beneficiary's response or progress toward goal(s):  Patient is in a contemplative stage of change to engage in treatment plan.

## 2023-05-23 NOTE — Patient Instructions (Signed)
Submit 2023 tax return to complete medassist application

## 2023-05-30 ENCOUNTER — Ambulatory Visit: Payer: Self-pay | Attending: Internal Medicine | Admitting: Internal Medicine

## 2023-05-30 ENCOUNTER — Encounter: Payer: Self-pay | Admitting: Internal Medicine

## 2023-05-30 VITALS — BP 128/88 | HR 87 | Ht 68.0 in | Wt 252.6 lb

## 2023-05-30 DIAGNOSIS — I10 Essential (primary) hypertension: Secondary | ICD-10-CM | POA: Insufficient documentation

## 2023-05-30 DIAGNOSIS — Z720 Tobacco use: Secondary | ICD-10-CM | POA: Insufficient documentation

## 2023-05-30 DIAGNOSIS — R079 Chest pain, unspecified: Secondary | ICD-10-CM | POA: Insufficient documentation

## 2023-05-30 MED ORDER — NITROGLYCERIN 0.4 MG SL SUBL: 0.4000 mg | SUBLINGUAL_TABLET | SUBLINGUAL | 2 refills | Status: AC | PRN

## 2023-05-30 NOTE — Patient Instructions (Signed)
Medication Instructions:  Your physician has recommended you make the following change in your medication:  Nitroglycerin 0.4 mg Dissolve one under tongue for chest pain every 5 minutes up to 3 doses. If no relief, proceed to ED.   Continue to take all other medications as prescribed  Labwork: None  Testing/Procedures: Your physician has requested that you have an echocardiogram. Echocardiography is a painless test that uses sound waves to create images of your heart. It provides your doctor with information about the size and shape of your heart and how well your heart's chambers and valves are working. This procedure takes approximately one hour. There are no restrictions for this procedure. Please do NOT wear cologne, perfume, aftershave, or lotions (deodorant is allowed). Please arrive 15 minutes prior to your appointment time.  Please note: We ask at that you not bring children with you during ultrasound (echo/ vascular) testing. Due to room size and safety concerns, children are not allowed in the ultrasound rooms during exams. Our front office staff cannot provide observation of children in our lobby area while testing is being conducted. An adult accompanying a patient to their appointment will only be allowed in the ultrasound room at the discretion of the ultrasound technician under special circumstances. We apologize for any inconvenience.  Your physician has requested that you have en exercise stress myoview. For further information please visit [email or web address] Please follow instruction sheet, as given.   Follow-Up: Your physician recommends that you schedule a follow-up appointment in: 3 months  Any Other Special Instructions Will Be Listed Below (If Applicable). Thank you for choosing  [facility]!      If you need a refill on your cardiac medications before your next appointment, please call your pharmacy.

## 2023-05-30 NOTE — Progress Notes (Signed)
Cardiology Office Note  Date: 05/30/2023   ID: Nicklaus, Mackie Feb 16, 1981, MRN [id number]  PCP:  Jacquelin Hawking, PA-C  Cardiologist:  None Electrophysiologist:  None   History of Present Illness: Lucas Dickerson is a 42 y.o. male known to have glaucoma, nicotine abuse was referred to cardiology clinic for evaluation of chest pain.  Ongoing exertional chest pain for the last 2 months.  Occurs with exertional activities like lifting heavy weights, running etc.  Last for a few minutes (less than 5 minutes) and resolves with rest.  Occurs few times per month.  Does not have a saline issue at home with him.  No other symptoms of SOB, dizziness, syncope, palpitations or leg swelling.  No claudication.  He used to smoke 1 pack/day in the past but after a death of a family member in 05-Jun-2020, he started to smoke 2 packs/day.  He has a lot of stress going on at home.  His brother has bipolar disorder and lives with him.  His income also reduced compared to prior years and he has a lot of bills piling up.  Past Medical History:  Diagnosis Date   Cataract    Glaucoma     Past Surgical History:  Procedure Laterality Date   EYE SURGERY      Current Outpatient Medications  Medication Sig Dispense Refill   DM-GG & DM-APAP-CPM (CORICIDIN HBP DAY/NIGHT COLD) 10-20 &15-200-2 MG MISC Take by mouth 2 (two) times daily.     losartan (COZAAR) 50 MG tablet Take 1 tablet (50 mg total) by mouth daily. 90 tablet 0   nitroGLYCERIN (NITROSTAT) 0.4 MG SL tablet Place 1 tablet (0.4 mg total) under the tongue every 5 (five) minutes x 3 doses as needed for chest pain (If no relief after 3rd dose proceed to ED or call 911). 25 tablet 2   omeprazole (PRILOSEC) 40 MG capsule Take 1 capsule (40 mg total) by mouth daily. 90 capsule 0   No current facility-administered medications for this visit.   Allergies:  Poison oak extract [poison oak extract]   Social History: The patient  reports that he has been  smoking cigarettes. He has a 41 pack-year smoking history. He has quit using smokeless tobacco.  His smokeless tobacco use included snuff. He reports that he does not drink alcohol and does not use drugs.   Family History: The patient's family history includes Diabetes in his brother, father, and mother; Heart disease in his mother; Hypertension in his brother, father, and mother.   ROS:  Please see the history of present illness. Otherwise, complete review of systems is positive for none  All other systems are reviewed and negative.   Physical Exam: VS:  BP 128/88   Pulse 87   Ht 5\' 8"  (1.727 m)   Wt 252 lb 9.6 oz (114.6 kg)   SpO2 96%   BMI 38.41 kg/m , BMI Body mass index is 38.41 kg/m.  Wt Readings from Last 3 Encounters:  05/30/23 252 lb 9.6 oz (114.6 kg)  05/23/23 251 lb (113.9 kg)  04/10/23 256 lb (116.1 kg)    General: Patient appears comfortable at rest. HEENT: Conjunctiva and lids normal, oropharynx clear with moist mucosa. Neck: Supple, no elevated JVP or carotid bruits, no thyromegaly. Lungs: Clear to auscultation, nonlabored breathing at rest. Cardiac: Regular rate and rhythm, no S3 or significant systolic murmur, no pericardial rub. Abdomen: Soft, nontender, no hepatomegaly, bowel sounds present, no guarding or rebound. Extremities: No pitting  edema, distal pulses 2+. Skin: Warm and dry. Musculoskeletal: No kyphosis. Neuropsychiatric: Alert and oriented x3, affect grossly appropriate.  Recent Labwork: 04/17/2023: ALT 13; AST 16; BUN 14; Creatinine, Ser 0.96; Potassium 4.0; Sodium 138; TSH 0.714     Component Value Date/Time   CHOL 171 04/17/2023 0806   TRIG 94 04/17/2023 0806   HDL 41 04/17/2023 0806   CHOLHDL 4.2 04/17/2023 0806   VLDL 19 04/17/2023 0806   LDLCALC 111 (H) 04/17/2023 0806     Assessment and Plan:   Chest pain: Ongoing chest pain with severe exertion (lifting heavy weights, running etc.) x 2 months, few times per month, lasting less than 5  minutes and resolving with rest.  Does not have a cell NTG at home.  Cardiac risk factors include significant smoking history (2 packs/day).  Obtain exercise Myoview and echocardiogram.  SL NTG 0.4 mg as needed.  Okay to switch to Lexiscan if unable to achieve target heart rate.  HTN, controlled but rule out masked HTN: Continue losartan 50 mg once daily.  He has a lot of stress going on at home, his blood pressures are always elevated at home.  Instruct him to check his BPs in a.m. and p.m., keep a log.  Nicotine abuse: He used to smoke 1 pack/day but in the last few years, smoking 2 packs/day.  Smoking cessation counseling provided.  He said he will eventually quit.      Medication Adjustments/Labs and Tests Ordered: Current medicines are reviewed at length with the patient today.  Concerns regarding medicines are outlined above.    Disposition:  Follow up  3 months  Signed Marionna Gonia Verne Spurr, MD, 05/30/2023 11:16 AM    [facility] [address] [place], [place], [place] [address]

## 2023-06-06 ENCOUNTER — Ambulatory Visit: Payer: Self-pay | Attending: Internal Medicine

## 2023-06-06 DIAGNOSIS — R079 Chest pain, unspecified: Secondary | ICD-10-CM

## 2023-06-06 LAB — ECHOCARDIOGRAM COMPLETE
AR max vel: 2.84 cm2
AV Area VTI: 3.23 cm2
AV Area mean vel: 3.04 cm2
AV Mean grad: 5 mm[Hg]
AV Peak grad: 8.2 mm[Hg]
Ao pk vel: 1.43 m/s
Area-P 1/2: 3.3 cm2
Calc EF: 54.2 %
MV VTI: 2.47 cm2
S' Lateral: 3.2 cm
Single Plane A2C EF: 46.8 %
Single Plane A4C EF: 58.6 %

## 2023-06-09 ENCOUNTER — Ambulatory Visit (HOSPITAL_COMMUNITY)
Admission: RE | Admit: 2023-06-09 | Discharge: 2023-06-09 | Disposition: A | Discharge status: Home / Self Care | Payer: Self-pay | Source: Ambulatory Visit | Attending: Internal Medicine | Admitting: Internal Medicine

## 2023-06-09 ENCOUNTER — Encounter (HOSPITAL_COMMUNITY)
Admission: RE | Admit: 2023-06-09 | Discharge: 2023-06-09 | Disposition: A | Discharge status: Home / Self Care | Payer: Self-pay | Source: Ambulatory Visit | Attending: Internal Medicine | Admitting: Internal Medicine

## 2023-06-09 DIAGNOSIS — R079 Chest pain, unspecified: Secondary | ICD-10-CM | POA: Insufficient documentation

## 2023-06-09 LAB — NM MYOCAR MULTI W/SPECT W/WALL MOTION / EF
Angina Index: 0
Duke Treadmill Score: 8
Estimated workload: 10.4
Exercise duration (min): 8 min
Exercise duration (sec): 3 s
LV dias vol: 96 mL (ref 62–150)
LV sys vol: 44 mL
MPHR: 153 {beats}/min
Nuc Stress EF: 55 %
Peak HR: 153 {beats}/min
Percent HR: 85 %
RATE: 0.4
RPE: 11
Rest HR: 64 {beats}/min
Rest Nuclear Isotope Dose: 10.7 mCi
SDS: 0
SRS: 1
SSS: 1
ST Depression (mm): 0 mm
Stress Nuclear Isotope Dose: 30.2 mCi
TID: 0.99

## 2023-06-09 MED ORDER — REGADENOSON 0.4 MG/5ML IV SOLN
INTRAVENOUS | Status: AC
  Filled 2023-06-09: qty 5

## 2023-06-09 MED ORDER — TECHNETIUM TC 99M TETROFOSMIN IV KIT
10.7000 | PACK | Freq: Once | INTRAVENOUS | Status: AC | PRN
  Administered 2023-06-09: 10.7 via INTRAVENOUS

## 2023-06-09 MED ORDER — TECHNETIUM TC 99M TETROFOSMIN IV KIT
30.2000 | PACK | Freq: Once | INTRAVENOUS | Status: AC | PRN
  Administered 2023-06-09: 30.2 via INTRAVENOUS

## 2023-06-27 ENCOUNTER — Encounter: Payer: Self-pay | Admitting: Physician Assistant

## 2023-06-27 ENCOUNTER — Ambulatory Visit: Payer: Self-pay

## 2023-06-27 ENCOUNTER — Ambulatory Visit: Payer: Self-pay | Admitting: Physician Assistant

## 2023-06-27 VITALS — BP 143/89 | HR 78 | Temp 96.0°F | Ht 68.0 in | Wt 256.2 lb

## 2023-06-27 DIAGNOSIS — I10 Essential (primary) hypertension: Secondary | ICD-10-CM

## 2023-06-27 MED ORDER — LOSARTAN POTASSIUM 100 MG PO TABS: 100.0000 mg | ORAL_TABLET | Freq: Every day | ORAL | 1 refills | Status: DC

## 2023-06-27 NOTE — Progress Notes (Signed)
   BP (!) 143/89   Pulse 78   Temp (!) 96 F (35.6 C)   Ht 5\' 8"  (1.727 m)   Wt 256 lb 4 oz (116.2 kg)   SpO2 98%   BMI 38.96 kg/m    Subjective:    Patient ID: Lucas Dickerson, male    DOB: 07/03/1980, 43 y.o.   MRN: [id number]  HPI: Lucas Dickerson is a 43 y.o. male presenting on 06/27/2023 for Blood Pressure Check   HPI  Chief Complaint  Patient presents with   Blood Pressure Check    Bp running high at home also.   He saw cardiology and evaluation was normal.   Pt says he feels well today.   He says his stress at home is per usual.   He is seeing [facility].    Relevant past medical, surgical, family and social history reviewed and updated as indicated. Interim medical history since our last visit reviewed. Allergies and medications reviewed and updated.    Current Outpatient Medications:    DM-GG & DM-APAP-CPM (CORICIDIN HBP DAY/NIGHT COLD) 10-20 &15-200-2 MG MISC, Take by mouth 2 (two) times daily., Disp: , Rfl:    losartan (COZAAR) 50 MG tablet, Take 1 tablet (50 mg total) by mouth daily., Disp: 90 tablet, Rfl: 0   nitroGLYCERIN (NITROSTAT) 0.4 MG SL tablet, Place 1 tablet (0.4 mg total) under the tongue every 5 (five) minutes x 3 doses as needed for chest pain (If no relief after 3rd dose proceed to ED or call 911)., Disp: 25 tablet, Rfl: 2   omeprazole (PRILOSEC) 40 MG capsule, Take 1 capsule (40 mg total) by mouth daily., Disp: 90 capsule, Rfl: 0    Review of Systems  Per HPI unless specifically indicated above     Objective:    BP (!) 143/89   Pulse 78   Temp (!) 96 F (35.6 C)   Ht 5\' 8"  (1.727 m)   Wt 256 lb 4 oz (116.2 kg)   SpO2 98%   BMI 38.96 kg/m   Wt Readings from Last 3 Encounters:  06/27/23 256 lb 4 oz (116.2 kg)  05/30/23 252 lb 9.6 oz (114.6 kg)  05/23/23 251 lb (113.9 kg)    Physical Exam Vitals reviewed.  Constitutional:      General: He is not in acute distress.    Appearance: He is well-developed. He is not toxic-appearing.  HENT:      Head: Normocephalic and atraumatic.  Cardiovascular:     Rate and Rhythm: Normal rate and regular rhythm.  Pulmonary:     Effort: Pulmonary effort is normal.     Breath sounds: Normal breath sounds. No wheezing.  Musculoskeletal:     Cervical back: Neck supple.     Right lower leg: No edema.     Left lower leg: No edema.  Lymphadenopathy:     Cervical: No cervical adenopathy.  Skin:    General: Skin is warm and dry.  Neurological:     Mental Status: He is alert and oriented to person, place, and time.  Psychiatric:        Behavior: Behavior normal.            Assessment & Plan:    Encounter Diagnosis  Name Primary?   Primary hypertension Yes     -Increase losartan -Pt to follow up one month to recheck bp.  He is to contact office sooner prn

## 2023-08-01 ENCOUNTER — Ambulatory Visit: Payer: Self-pay

## 2023-08-01 ENCOUNTER — Ambulatory Visit: Payer: Self-pay | Admitting: Physician Assistant

## 2023-08-24 ENCOUNTER — Ambulatory Visit: Payer: Self-pay | Attending: Internal Medicine | Admitting: Internal Medicine

## 2023-08-24 NOTE — Progress Notes (Signed)
 Erroneous encounter - please disregard.

## 2023-10-16 ENCOUNTER — Other Ambulatory Visit: Payer: Self-pay | Admitting: Physician Assistant

## 2023-10-16 MED ORDER — LOSARTAN POTASSIUM 100 MG PO TABS: 100.0000 mg | ORAL_TABLET | Freq: Every day | ORAL | 0 refills | Status: DC

## 2023-10-18 ENCOUNTER — Ambulatory Visit: Payer: Self-pay | Admitting: Physician Assistant

## 2023-10-26 ENCOUNTER — Encounter: Payer: Self-pay | Admitting: Physician Assistant

## 2023-10-26 ENCOUNTER — Ambulatory Visit: Payer: Self-pay | Admitting: Physician Assistant

## 2023-10-26 VITALS — BP 139/98 | HR 88 | Temp 97.9°F | Ht 68.0 in | Wt 254.0 lb

## 2023-10-26 DIAGNOSIS — I10 Essential (primary) hypertension: Secondary | ICD-10-CM

## 2023-10-26 DIAGNOSIS — F17200 Nicotine dependence, unspecified, uncomplicated: Secondary | ICD-10-CM

## 2023-10-26 DIAGNOSIS — F439 Reaction to severe stress, unspecified: Secondary | ICD-10-CM

## 2023-10-26 DIAGNOSIS — K219 Gastro-esophageal reflux disease without esophagitis: Secondary | ICD-10-CM

## 2023-10-26 MED ORDER — LOSARTAN POTASSIUM 100 MG PO TABS: 100.0000 mg | ORAL_TABLET | Freq: Every day | ORAL | 3 refills | Status: DC

## 2023-10-26 MED ORDER — AMLODIPINE BESYLATE 5 MG PO TABS: 5.0000 mg | ORAL_TABLET | Freq: Every day | ORAL | 4 refills | Status: DC

## 2023-10-26 MED ORDER — OMEPRAZOLE 40 MG PO CPDR: 40.0000 mg | DELAYED_RELEASE_CAPSULE | Freq: Every day | ORAL | 3 refills | Status: DC

## 2023-10-26 NOTE — Progress Notes (Signed)
 BP (!) 139/98   Pulse 88   Temp 97.9 F (36.6 C)   Ht 5\' 8"  (1.727 m)   SpO2 97%   BMI 38.96 kg/m    Subjective:    Patient ID: Lucas Dickerson, male    DOB: [birth year], 43 y.o.   MRN: [id number]  HPI: Lucas Dickerson is a 43 y.o. male presenting on 10/26/2023 for Follow-up   HPI  Pt is 42yoM in today for recheck of bp.  He says she still has substantial stress at home and hasn't yet been able to secure his own living situation.  he doesn't want to see counselor any longer because it doesn't change his circumstances.  Other than his stressful home situation, he is doing fine.  He has not taken any NTG and he continues to work as a Curator.    Relevant past medical, surgical, family and social history reviewed and updated as indicated. Interim medical history since our last visit reviewed. Allergies and medications reviewed and updated.   Current Outpatient Medications:    cetirizine (ZYRTEC) 10 MG tablet, Take 10 mg by mouth daily., Disp: , Rfl:    DM-GG & DM-APAP-CPM (CORICIDIN HBP DAY/NIGHT COLD) 10-20 &15-200-2 MG MISC, Take by mouth 2 (two) times daily., Disp: , Rfl:    losartan  (COZAAR ) 100 MG tablet, Take 1 tablet (100 mg total) by mouth daily., Disp: 30 tablet, Rfl: 0   omeprazole  (PRILOSEC) 40 MG capsule, Take 1 capsule (40 mg total) by mouth daily., Disp: 90 capsule, Rfl: 0   nitroGLYCERIN  (NITROSTAT ) 0.4 MG SL tablet, Place 1 tablet (0.4 mg total) under the tongue every 5 (five) minutes x 3 doses as needed for chest pain (If no relief after 3rd dose proceed to ED or call 911). (Patient not taking: Reported on 10/26/2023), Disp: 25 tablet, Rfl: 2    Review of Systems  Per HPI unless specifically indicated above     Objective:     BP (!) 139/98   Pulse 88   Temp 97.9 F (36.6 C)   Ht 5\' 8"  (1.727 m)   SpO2 97%   BMI 38.96 kg/m   Wt Readings from Last 3 Encounters:  06/27/23 256 lb 4 oz (116.2 kg)  05/30/23 252 lb 9.6 oz (114.6 kg)  05/23/23 251 lb (113.9  kg)    Physical Exam Vitals reviewed.  Constitutional:      General: He is not in acute distress.    Appearance: He is well-developed. He is obese. He is not toxic-appearing.  HENT:     Head: Normocephalic and atraumatic.  Cardiovascular:     Rate and Rhythm: Normal rate and regular rhythm.  Pulmonary:     Effort: Pulmonary effort is normal.     Breath sounds: Normal breath sounds. No wheezing.  Abdominal:     General: Bowel sounds are normal.     Palpations: Abdomen is soft.     Tenderness: There is no abdominal tenderness.  Musculoskeletal:     Cervical back: Neck supple.     Right lower leg: No edema.     Left lower leg: No edema.  Lymphadenopathy:     Cervical: No cervical adenopathy.  Skin:    General: Skin is warm and dry.  Neurological:     Mental Status: He is alert and oriented to person, place, and time.  Psychiatric:        Behavior: Behavior normal.         Assessment & Plan:  Encounter Diagnoses  Name Primary?   Primary hypertension Yes   Stress at home    Tobacco use disorder    Gastroesophageal reflux disease, unspecified whether esophagitis present      -pt to continue losartan  and add amlodipine -check bmp -continue omeprazole  for gerd -pt reminded to speak with [facility] about living options -pt to RTO one month for recheck bp.  He is to contact office sooner prn

## 2023-11-27 ENCOUNTER — Other Ambulatory Visit (HOSPITAL_COMMUNITY)
Admission: RE | Admit: 2023-11-27 | Discharge: 2023-11-27 | Disposition: A | Discharge status: Home / Self Care | Payer: Self-pay | Source: Ambulatory Visit | Attending: Physician Assistant | Admitting: Physician Assistant

## 2023-11-27 DIAGNOSIS — I10 Essential (primary) hypertension: Secondary | ICD-10-CM | POA: Insufficient documentation

## 2023-11-27 LAB — BASIC METABOLIC PANEL WITH GFR
Anion gap: 11 (ref 5–15)
BUN: 13 mg/dL (ref 6–20)
CO2: 23 mmol/L (ref 22–32)
Calcium: 8.9 mg/dL (ref 8.9–10.3)
Chloride: 105 mmol/L (ref 98–111)
Creatinine, Ser: 0.96 mg/dL (ref 0.61–1.24)
GFR, Estimated: >60 mL/min (ref >60)
Glucose, Bld: 112 mg/dL — ABNORMAL HIGH (ref 70–99)
Potassium: 4 mmol/L (ref 3.5–5.1)
Sodium: 139 mmol/L (ref 135–145)

## 2023-11-28 ENCOUNTER — Ambulatory Visit: Payer: Self-pay | Admitting: Physician Assistant

## 2023-11-28 ENCOUNTER — Encounter: Payer: Self-pay | Admitting: Physician Assistant

## 2023-11-28 VITALS — BP 120/82 | HR 94 | Temp 98.7°F | Ht 68.0 in | Wt 254.0 lb

## 2023-11-28 DIAGNOSIS — I10 Essential (primary) hypertension: Secondary | ICD-10-CM

## 2023-11-28 NOTE — Progress Notes (Signed)
 BP 120/82   Pulse 94   Temp 98.7 F (37.1 C)   Wt 254 lb (115.2 kg)   SpO2 97%   BMI 38.62 kg/m    Subjective:    Patient ID: Lucas Dickerson, male    DOB: December 11, 1980, 43 y.o.   MRN: [id number]  HPI: Lucas Dickerson is a 43 y.o. male presenting on 11/28/2023 for Hypertension   HPI  Pt was seen last month for HTN and amlodipine  was added to his losartan  due to the bp being uncontrolled.     Relevant past medical, surgical, family and social history reviewed and updated as indicated. Interim medical history since our last visit reviewed. Allergies and medications reviewed and updated.   Current Outpatient Medications:    amLODipine  (NORVASC ) 5 MG tablet, Take 1 tablet (5 mg total) by mouth daily., Disp: 30 tablet, Rfl: 4   cetirizine (ZYRTEC) 10 MG tablet, Take 10 mg by mouth daily., Disp: , Rfl:    losartan  (COZAAR ) 100 MG tablet, Take 1 tablet (100 mg total) by mouth daily., Disp: 30 tablet, Rfl: 3   nitroGLYCERIN  (NITROSTAT ) 0.4 MG SL tablet, Place 1 tablet (0.4 mg total) under the tongue every 5 (five) minutes x 3 doses as needed for chest pain (If no relief after 3rd dose proceed to ED or call 911)., Disp: 25 tablet, Rfl: 2   omeprazole  (PRILOSEC) 40 MG capsule, Take 1 capsule (40 mg total) by mouth daily., Disp: 30 capsule, Rfl: 3   DM-GG & DM-APAP-CPM (CORICIDIN HBP DAY/NIGHT COLD) 10-20 &15-200-2 MG MISC, Take by mouth 2 (two) times daily. (Patient not taking: Reported on 11/28/2023), Disp: , Rfl:    Review of Systems  Per HPI unless specifically indicated above     Objective:    BP 120/82   Pulse 94   Temp 98.7 F (37.1 C)   Wt 254 lb (115.2 kg)   SpO2 97%   BMI 38.62 kg/m   Wt Readings from Last 3 Encounters:  11/28/23 254 lb (115.2 kg)  10/26/23 254 lb (115.2 kg)  06/27/23 256 lb 4 oz (116.2 kg)    Physical Exam Vitals reviewed.  Constitutional:      General: He is not in acute distress.    Appearance: He is well-developed. He is not toxic-appearing.   HENT:     Head: Normocephalic and atraumatic.   Cardiovascular:     Rate and Rhythm: Normal rate and regular rhythm.  Pulmonary:     Effort: Pulmonary effort is normal.     Breath sounds: Normal breath sounds. No wheezing.   Musculoskeletal:     Cervical back: Neck supple.     Right lower leg: No edema.     Left lower leg: No edema.  Lymphadenopathy:     Cervical: No cervical adenopathy.   Skin:    General: Skin is warm and dry.   Neurological:     Mental Status: He is alert and oriented to person, place, and time.   Psychiatric:        Behavior: Behavior normal.     Results for orders placed or performed during the hospital encounter of 11/27/23  Basic metabolic panel with GFR   Collection Time: 11/27/23  8:09 AM  Result Value Ref Range   Sodium 139 135 - 145 mmol/L   Potassium 4.0 3.5 - 5.1 mmol/L   Chloride 105 98 - 111 mmol/L   CO2 23 22 - 32 mmol/L   Glucose, Bld 112 (H) 70 -  99 mg/dL   BUN 13 6 - 20 mg/dL   Creatinine, Ser 9.03 0.61 - 1.24 mg/dL   Calcium 8.9 8.9 - 89.6 mg/dL   GFR, Estimated >39 >39 mL/min   Anion gap 11 5 - 15      Assessment & Plan:    Encounter Diagnosis  Name Primary?   Primary hypertension Yes     -reviewed labs with pt -pt to continue current medications -pt to follow up 3 months. He is to contact office sooner prn

## 2023-12-04 ENCOUNTER — Other Ambulatory Visit: Payer: Self-pay | Admitting: Physician Assistant

## 2023-12-04 MED ORDER — LOSARTAN POTASSIUM 100 MG PO TABS: 100.0000 mg | ORAL_TABLET | Freq: Every day | ORAL | 3 refills | Status: AC

## 2024-02-22 ENCOUNTER — Other Ambulatory Visit: Payer: Self-pay | Admitting: Physician Assistant

## 2024-02-28 ENCOUNTER — Ambulatory Visit: Payer: Self-pay | Admitting: Physician Assistant

## 2024-02-28 ENCOUNTER — Encounter: Payer: Self-pay | Admitting: Physician Assistant

## 2024-02-28 VITALS — BP 135/85 | HR 93 | Temp 98.2°F | Ht 68.0 in | Wt 255.8 lb

## 2024-02-28 DIAGNOSIS — J069 Acute upper respiratory infection, unspecified: Secondary | ICD-10-CM

## 2024-02-28 DIAGNOSIS — F17200 Nicotine dependence, unspecified, uncomplicated: Secondary | ICD-10-CM

## 2024-02-28 DIAGNOSIS — F439 Reaction to severe stress, unspecified: Secondary | ICD-10-CM

## 2024-02-28 DIAGNOSIS — I10 Essential (primary) hypertension: Secondary | ICD-10-CM

## 2024-02-28 MED ORDER — AMLODIPINE BESYLATE 5 MG PO TABS: 5.0000 mg | ORAL_TABLET | Freq: Every day | ORAL | 4 refills | Status: DC

## 2024-02-28 NOTE — Progress Notes (Signed)
 BP 135/85   Pulse 93   Temp 98.2 F (36.8 C)   Ht 5' 8 (1.727 m)   Wt 255 lb 12 oz (116 kg)   SpO2 96%   BMI 38.89 kg/m    Subjective:    Patient ID: Lucas Dickerson, male    DOB: 19-Mar-1981, 43 y.o.   MRN: [id number]  HPI: Lucas Dickerson is a 43 y.o. male presenting on 02/28/2024 for Hypertension and Cough (Since Friday. Pt states he has been coughing up thick milky white/ yellow mucous mostly in the morning and few times throughout the day. )   HPI   Chief Complaint  Patient presents with   Hypertension   Cough    Since Friday. Pt states he has been coughing up thick milky white/ yellow mucous mostly in the morning and few times throughout the day.      Pt is doing well emotionally.  He is  moving soon.  This will benefit him as he has been living in an environment that causes him distress.  His new address is:  [address] [place]  Pt has had no fever. He continues to smoke but is working on stopping.   Relevant past medical, surgical, family and social history reviewed and updated as indicated. Interim medical history since our last visit reviewed. Allergies and medications reviewed and updated.   Current Outpatient Medications:    cetirizine (ZYRTEC) 10 MG tablet, Take 10 mg by mouth daily., Disp: , Rfl:    DM-GG & DM-APAP-CPM (CORICIDIN HBP DAY/NIGHT COLD) 10-20 &15-200-2 MG MISC, Take by mouth 2 (two) times daily., Disp: , Rfl:    losartan  (COZAAR ) 100 MG tablet, Take 1 tablet (100 mg total) by mouth daily., Disp: 30 tablet, Rfl: 3   nitroGLYCERIN  (NITROSTAT ) 0.4 MG SL tablet, Place 1 tablet (0.4 mg total) under the tongue every 5 (five) minutes x 3 doses as needed for chest pain (If no relief after 3rd dose proceed to ED or call 911)., Disp: 25 tablet, Rfl: 2   omeprazole  (PRILOSEC) 40 MG capsule, Take 1 capsule by mouth once daily, Disp: 30 capsule, Rfl: 0   amLODipine  (NORVASC ) 5 MG tablet, Take 1 tablet (5 mg total) by mouth daily., Disp: 30 tablet, Rfl:  4   Review of Systems  Per HPI unless specifically indicated above     Objective:    BP 135/85   Pulse 93   Temp 98.2 F (36.8 C)   Ht 5' 8 (1.727 m)   Wt 255 lb 12 oz (116 kg)   SpO2 96%   BMI 38.89 kg/m   Wt Readings from Last 3 Encounters:  02/28/24 255 lb 12 oz (116 kg)  11/28/23 254 lb (115.2 kg)  10/26/23 254 lb (115.2 kg)    Physical Exam Vitals reviewed.  Constitutional:      General: He is not in acute distress.    Appearance: He is well-developed. He is not toxic-appearing.  HENT:     Head: Normocephalic and atraumatic.     Nose: Congestion and rhinorrhea present.  Cardiovascular:     Rate and Rhythm: Normal rate and regular rhythm.  Pulmonary:     Effort: Pulmonary effort is normal.     Breath sounds: Normal breath sounds. No wheezing.  Abdominal:     General: Bowel sounds are normal.     Palpations: Abdomen is soft.     Tenderness: There is no abdominal tenderness.  Musculoskeletal:     Cervical  back: Neck supple.     Right lower leg: No edema.     Left lower leg: No edema.  Lymphadenopathy:     Cervical: No cervical adenopathy.  Skin:    General: Skin is warm and dry.  Neurological:     Mental Status: He is alert and oriented to person, place, and time.  Psychiatric:        Behavior: Behavior normal.          Assessment & Plan:    Encounter Diagnoses  Name Primary?   Primary hypertension Yes   Stress at home    Tobacco use disorder    Upper respiratory tract infection, unspecified type      -no changes to medication today -pt encouraged to continue efforts on smoking cessation -pt encouraged to get [facility]ist and notify us  so his meds can be sent to his new address. -pt to follow up two months for bp recheck.  He is to contact office sooner prn

## 2024-05-08 ENCOUNTER — Ambulatory Visit: Payer: Self-pay | Admitting: Physician Assistant

## 2024-05-08 ENCOUNTER — Encounter: Payer: Self-pay | Admitting: Physician Assistant

## 2024-05-08 VITALS — BP 144/88 | HR 81 | Temp 98.2°F | Ht 68.0 in | Wt 242.0 lb

## 2024-05-08 DIAGNOSIS — I10 Essential (primary) hypertension: Secondary | ICD-10-CM

## 2024-05-08 MED ORDER — AMLODIPINE BESYLATE 10 MG PO TABS: 10.0000 mg | ORAL_TABLET | Freq: Every day | ORAL | 1 refills | Status: DC

## 2024-05-08 NOTE — Progress Notes (Signed)
 BP (!) 144/88   Pulse 81   Temp 98.2 F (36.8 C)   Ht 5' 8 (1.727 m)   Wt 242 lb (109.8 kg)   SpO2 98%   BMI 36.80 kg/m    Subjective:    Patient ID: Lucas Dickerson, male    DOB: [birth year], 43 y.o.   MRN: [id number]  HPI: Lucas Dickerson is a 43 y.o. male presenting on 05/08/2024 for Hypertension   HPI  Chief Complaint  Patient presents with   Hypertension    Pt is feeling much better now that he has moved.  He has no complaints today.    Relevant past medical, surgical, family and social history reviewed and updated as indicated. Interim medical history since our last visit reviewed. Allergies and medications reviewed and updated.   Current Outpatient Medications:    amLODipine  (NORVASC ) 5 MG tablet, Take 1 tablet (5 mg total) by mouth daily., Disp: 30 tablet, Rfl: 4   DM-GG & DM-APAP-CPM (CORICIDIN HBP DAY/NIGHT COLD) 10-20 &15-200-2 MG MISC, Take by mouth 2 (two) times daily., Disp: , Rfl:    losartan  (COZAAR ) 100 MG tablet, Take 1 tablet (100 mg total) by mouth daily., Disp: 30 tablet, Rfl: 3   nitroGLYCERIN  (NITROSTAT ) 0.4 MG SL tablet, Place 1 tablet (0.4 mg total) under the tongue every 5 (five) minutes x 3 doses as needed for chest pain (If no relief after 3rd dose proceed to ED or call 911)., Disp: 25 tablet, Rfl: 2   omeprazole  (PRILOSEC) 40 MG capsule, Take 1 capsule by mouth once daily, Disp: 30 capsule, Rfl: 0   cetirizine (ZYRTEC) 10 MG tablet, Take 10 mg by mouth daily. (Patient not taking: Reported on 05/08/2024), Disp: , Rfl:     Review of Systems  Per HPI unless specifically indicated above     Objective:    BP (!) 144/88   Pulse 81   Temp 98.2 F (36.8 C)   Ht 5' 8 (1.727 m)   Wt 242 lb (109.8 kg)   SpO2 98%   BMI 36.80 kg/m   Wt Readings from Last 3 Encounters:  05/08/24 242 lb (109.8 kg)  02/28/24 255 lb 12 oz (116 kg)  11/28/23 254 lb (115.2 kg)    Physical Exam Vitals reviewed.  Constitutional:      General: He is not in acute  distress.    Appearance: He is well-developed. He is not toxic-appearing.  HENT:     Head: Normocephalic and atraumatic.  Cardiovascular:     Rate and Rhythm: Normal rate and regular rhythm.  Pulmonary:     Effort: Pulmonary effort is normal.     Breath sounds: Normal breath sounds. No wheezing.  Abdominal:     General: Bowel sounds are normal.     Palpations: Abdomen is soft.     Tenderness: There is no abdominal tenderness.  Musculoskeletal:     Cervical back: Neck supple.     Right lower leg: No edema.     Left lower leg: No edema.  Lymphadenopathy:     Cervical: No cervical adenopathy.  Skin:    General: Skin is warm and dry.  Neurological:     Mental Status: He is alert and oriented to person, place, and time.  Psychiatric:        Behavior: Behavior normal.           Assessment & Plan:    Encounter Diagnosis  Name Primary?   Primary hypertension Yes     -  will increase amlodipine  and continue losartan  -pt to follow up one month to recheck. -pt is reminded to update enrollment which expired in October

## 2024-05-08 NOTE — Patient Instructions (Signed)
Update enrollment

## 2024-05-10 ENCOUNTER — Other Ambulatory Visit: Payer: Self-pay | Admitting: Physician Assistant

## 2024-05-21 ENCOUNTER — Other Ambulatory Visit: Payer: Self-pay | Admitting: Physician Assistant

## 2024-05-21 DIAGNOSIS — I10 Essential (primary) hypertension: Secondary | ICD-10-CM

## 2024-06-11 ENCOUNTER — Other Ambulatory Visit (HOSPITAL_COMMUNITY)
Admission: RE | Admit: 2024-06-11 | Discharge: 2024-06-11 | Disposition: A | Discharge status: Home / Self Care | Payer: Self-pay | Source: Ambulatory Visit | Attending: Physician Assistant | Admitting: Physician Assistant

## 2024-06-11 DIAGNOSIS — I10 Essential (primary) hypertension: Secondary | ICD-10-CM | POA: Insufficient documentation

## 2024-06-11 LAB — BASIC METABOLIC PANEL WITH GFR
Anion gap: 15 (ref 5–15)
BUN: 14 mg/dL (ref 6–20)
CO2: 21 mmol/L — ABNORMAL LOW (ref 22–32)
Calcium: 9.2 mg/dL (ref 8.9–10.3)
Chloride: 103 mmol/L (ref 98–111)
Creatinine, Ser: 1 mg/dL (ref 0.61–1.24)
GFR, Estimated: >60 mL/min
Glucose, Bld: 102 mg/dL — ABNORMAL HIGH (ref 70–99)
Potassium: 3.7 mmol/L (ref 3.5–5.1)
Sodium: 139 mmol/L (ref 135–145)

## 2024-06-12 ENCOUNTER — Ambulatory Visit: Payer: Self-pay | Admitting: Physician Assistant

## 2024-06-12 ENCOUNTER — Encounter: Payer: Self-pay | Admitting: Physician Assistant

## 2024-06-12 VITALS — BP 137/90 | HR 76 | Temp 97.8°F | Ht 68.0 in | Wt 237.8 lb

## 2024-06-12 DIAGNOSIS — I10 Essential (primary) hypertension: Secondary | ICD-10-CM

## 2024-06-12 DIAGNOSIS — Z23 Encounter for immunization: Secondary | ICD-10-CM

## 2024-06-12 DIAGNOSIS — F17200 Nicotine dependence, unspecified, uncomplicated: Secondary | ICD-10-CM

## 2024-06-12 MED ORDER — ALBUTEROL SULFATE HFA 108 (90 BASE) MCG/ACT IN AERS: 2.0000 | INHALATION_SPRAY | Freq: Four times a day (QID) | RESPIRATORY_TRACT | Status: AC | PRN

## 2024-06-12 NOTE — Patient Instructions (Addendum)
 UPDATE YOUR ENROLLMENT WITH [facility]  ----------------------------------------------------------   How to Use an Inhaler This video will teach you how to use a metered dose inhaler. To view the content, go to this web address: [email or web address]  This video will expire on: 05/17/2025. If you need access to this video following this date, please reach out to the healthcare provider who assigned it to you. This information is not intended to replace advice given to you by your health care provider. Make sure you discuss any questions you have with your health care provider.  ---------------------------------------------------------------------------------------------- Elsevier Patient Education  2024 [employer]Influenza (Flu) Vaccine (Inactivated or Recombinant): What You Need to Know Many vaccine information statements are available in Spanish and other languages. See promoage.com.br. 1. Why get vaccinated? Influenza vaccine can prevent influenza (flu). Flu is a contagious disease that spreads around the [place]  every year, usually between October and May. Anyone can get the flu, but it is more dangerous for some people. Infants and young children, people 63 years and older, pregnant people, and people with certain health conditions or a weakened immune system are at greatest risk of flu complications. Pneumonia, bronchitis, sinus infections, and ear infections are examples of flu-related complications. If you have a medical condition, such as heart disease, cancer, or diabetes, flu can make it worse. Flu can cause fever and chills, sore throat, muscle aches, fatigue, cough, headache, and runny or stuffy nose. Some people may have vomiting and diarrhea, though this is more common in children than adults. In an average year, thousands of people in the [place]  die from flu, and many more are hospitalized. Flu vaccine prevents millions of illnesses and  flu-related visits to the doctor each year. 2. Influenza vaccines CDC recommends everyone 6 months and older get vaccinated every flu season. Children 6 months through 70 years of age may need 2 doses during a single flu season. Everyone else needs only 1 dose each flu season. It takes about 2 weeks for protection to develop after vaccination. There are many flu viruses, and they are always changing. Each year a new flu vaccine is made to protect against the influenza viruses believed to be likely to cause disease in the upcoming flu season. Even when the vaccine doesn't exactly match these viruses, it may still provide some protection. Influenza vaccine does not cause flu. Influenza vaccine may be given at the same time as other vaccines. 3. Talk with your health care provider Tell your vaccination provider if the person getting the vaccine: Has had an allergic reaction after a previous dose of influenza vaccine, or has any severe, life-threatening allergies Has ever had Guillain-Barr Syndrome (also called GBS) In some cases, your health care provider may decide to postpone influenza vaccination until a future visit. Influenza vaccine can be administered at any time during pregnancy. People who are or will be pregnant during influenza season should receive inactivated influenza vaccine. People with minor illnesses, such as a cold, may be vaccinated. People who are moderately or severely ill should usually wait until they recover before getting influenza vaccine. Your health care provider can give you more information. 4. Risks of a vaccine reaction Soreness, redness, and swelling where the shot is given, fever, muscle aches, and headache can happen after influenza vaccination. There may be a very small increased risk of Guillain-Barr Syndrome (GBS) after inactivated influenza vaccine (the flu shot). Young children who get the flu shot along with pneumococcal vaccine (PCV13) and/or DTaP vaccine  at the  same time might be slightly more likely to have a seizure caused by fever. Tell your health care provider if a child who is getting flu vaccine has ever had a seizure. People sometimes faint after medical procedures, including vaccination. Tell your provider if you feel dizzy or have vision changes or ringing in the ears. As with any medicine, there is a very remote chance of a vaccine causing a severe allergic reaction, other serious injury, or death. 5. What if there is a serious problem? An allergic reaction could occur after the vaccinated person leaves the clinic. If you see signs of a severe allergic reaction (hives, swelling of the face and throat, difficulty breathing, a fast heartbeat, dizziness, or weakness), call 9-1-1 and get the person to the nearest hospital. For other signs that concern you, call your health care provider. Adverse reactions should be reported to the Vaccine Adverse Event Reporting System (VAERS). Your health care provider will usually file this report, or you can do it yourself. Visit the VAERS website at www.vaers.lagents.no or call 802-202-0633. VAERS is only for reporting reactions, and VAERS staff members do not give medical advice. 6. The National Vaccine Injury Compensation Program The [employer] Vaccine Injury Compensation Program (VICP) is a federal program that was created to compensate people who may have been injured by certain vaccines. Claims regarding alleged injury or death due to vaccination have a time limit for filing, which may be as short as two years. Visit the VICP website at spiritualword.at or call 6578234280 to learn about the program and about filing a claim. 7. How can I learn more? Ask your health care provider. Call your local or state health department. Visit the website of the Food and Drug Administration (FDA) for vaccine package inserts and additional information at  finderlist.no. Contact the Centers for Disease Control and Prevention (CDC): Call 575-651-6741 (1-800-CDC-INFO) or Visit CDC's website at biotechroom.com.cy. Source: CDC Vaccine Information Statement Inactivated Influenza Vaccine (01/10/2020) This same material is available at footballexhibition.com.br for no charge. This information is not intended to replace advice given to you by your health care provider. Make sure you discuss any questions you have with your health care provider. Document Revised: 09/07/2022 Document Reviewed: 06/13/2022 Elsevier Patient Education  2024 [employer].

## 2024-06-12 NOTE — Progress Notes (Signed)
 "  BP (!) 137/90   Pulse 76   Temp 97.8 F (36.6 C)   Ht 5' 8 (1.727 m)   Wt 237 lb 12 oz (107.8 kg)   SpO2 95%   BMI 36.15 kg/m    Subjective:    Patient ID: Lucas Dickerson, male    DOB: 08/31/1980, 44 y.o.   MRN: [id number]  HPI: Lucas Dickerson is a 44 y.o. male presenting on 06/12/2024 for Hypertension   HPI  Chief Complaint  Patient presents with   Hypertension    Pt says he is doing well.  He is still enjoying living in his new place.  He has no complaints but does admit that he sometimes has wheezing.  He continues to smoke.     Relevant past medical, surgical, family and social history reviewed and updated as indicated. Interim medical history since our last visit reviewed. Allergies and medications reviewed and updated.   Current Outpatient Medications:    amLODipine  (NORVASC ) 10 MG tablet, Take 1 tablet (10 mg total) by mouth daily., Disp: 30 tablet, Rfl: 1   cetirizine (ZYRTEC) 10 MG tablet, Take 10 mg by mouth daily., Disp: , Rfl:    DM-GG & DM-APAP-CPM (CORICIDIN HBP DAY/NIGHT COLD) 10-20 &15-200-2 MG MISC, Take by mouth 2 (two) times daily., Disp: , Rfl:    losartan  (COZAAR ) 100 MG tablet, Take 1 tablet (100 mg total) by mouth daily., Disp: 30 tablet, Rfl: 3   nitroGLYCERIN  (NITROSTAT ) 0.4 MG SL tablet, Place 1 tablet (0.4 mg total) under the tongue every 5 (five) minutes x 3 doses as needed for chest pain (If no relief after 3rd dose proceed to ED or call 911)., Disp: 25 tablet, Rfl: 2   omeprazole  (PRILOSEC) 40 MG capsule, Take 1 capsule by mouth once daily, Disp: 30 capsule, Rfl: 1    Review of Systems  Per HPI unless specifically indicated above     Objective:    BP (!) 137/90   Pulse 76   Temp 97.8 F (36.6 C)   Ht 5' 8 (1.727 m)   Wt 237 lb 12 oz (107.8 kg)   SpO2 95%   BMI 36.15 kg/m   Wt Readings from Last 3 Encounters:  06/12/24 237 lb 12 oz (107.8 kg)  05/08/24 242 lb (109.8 kg)  02/28/24 255 lb 12 oz (116 kg)    Physical  Exam Vitals reviewed.  Constitutional:      General: He is not in acute distress.    Appearance: He is well-developed. He is not toxic-appearing.  HENT:     Head: Normocephalic and atraumatic.  Cardiovascular:     Rate and Rhythm: Normal rate and regular rhythm.  Pulmonary:     Effort: Pulmonary effort is normal.     Breath sounds: Normal breath sounds. No wheezing.  Musculoskeletal:     Cervical back: Neck supple.     Right lower leg: No edema.     Left lower leg: No edema.  Lymphadenopathy:     Cervical: No cervical adenopathy.  Skin:    General: Skin is warm and dry.  Neurological:     Mental Status: He is alert and oriented to person, place, and time.  Psychiatric:        Behavior: Behavior normal.     Results for orders placed or performed during the hospital encounter of 06/11/24  Basic metabolic panel   Collection Time: 06/11/24  4:00 PM  Result Value Ref Range   Sodium 139  135 - 145 mmol/L   Potassium 3.7 3.5 - 5.1 mmol/L   Chloride 103 98 - 111 mmol/L   CO2 21 (L) 22 - 32 mmol/L   Glucose, Bld 102 (H) 70 - 99 mg/dL   BUN 14 6 - 20 mg/dL   Creatinine, Ser 8.99 0.61 - 1.24 mg/dL   Calcium 9.2 8.9 - 89.6 mg/dL   GFR, Estimated >39 >39 mL/min   Anion gap 15 5 - 15      Assessment & Plan:    Encounter Diagnoses  Name Primary?   Primary hypertension Yes   Tobacco use disorder    Need for influenza vaccination      -reviewed labs with pt -pt to continue current medications -pt was given sample albuterol  MDI to use prn and was educated on how to use it -smoking cessation was recomended -pt was given flu shot in office by nurse.  He was given VIS -pt encouraged to get his enrollment updated -pt will follow up 6 weeks to recheck bp.  He is to contact office sooner prn  "

## 2024-07-11 ENCOUNTER — Other Ambulatory Visit: Payer: Self-pay | Admitting: Physician Assistant

## 2024-07-12 ENCOUNTER — Other Ambulatory Visit: Payer: Self-pay | Admitting: Physician Assistant

## 2024-07-30 ENCOUNTER — Ambulatory Visit: Payer: Self-pay | Admitting: Physician Assistant
